# Patient Record
Sex: Female | Born: 1960 | Race: White | Hispanic: No | Marital: Married | Smoking: Never smoker
Health system: Southern US, Community
[De-identification: ages and names within clinical notes are randomized; demographics above are authoritative.]

## PROBLEM LIST (undated history)

## (undated) DIAGNOSIS — I872 Venous insufficiency (chronic) (peripheral): Secondary | ICD-10-CM

## (undated) DIAGNOSIS — E785 Hyperlipidemia, unspecified: Secondary | ICD-10-CM

## (undated) DIAGNOSIS — I10 Essential (primary) hypertension: Secondary | ICD-10-CM

## (undated) DIAGNOSIS — F32A Depression, unspecified: Secondary | ICD-10-CM

## (undated) DIAGNOSIS — R768 Other specified abnormal immunological findings in serum: Secondary | ICD-10-CM

## (undated) DIAGNOSIS — IMO0002 Reserved for concepts with insufficient information to code with codable children: Secondary | ICD-10-CM

## (undated) DIAGNOSIS — G629 Polyneuropathy, unspecified: Secondary | ICD-10-CM

## (undated) DIAGNOSIS — R7689 Other specified abnormal immunological findings in serum: Secondary | ICD-10-CM

## (undated) DIAGNOSIS — E079 Disorder of thyroid, unspecified: Secondary | ICD-10-CM

## (undated) HISTORY — DX: Other specified abnormal immunological findings in serum: R76.89

## (undated) HISTORY — DX: Other specified abnormal immunological findings in serum: R76.8

## (undated) HISTORY — DX: Polyneuropathy, unspecified: G62.9

## (undated) HISTORY — DX: Venous insufficiency (chronic) (peripheral): I87.2

## (undated) HISTORY — DX: Hyperlipidemia, unspecified: E78.5

## (undated) HISTORY — DX: Reserved for concepts with insufficient information to code with codable children: IMO0002

## (undated) HISTORY — DX: Depression, unspecified: F32.A

## (undated) HISTORY — DX: Disorder of thyroid, unspecified: E07.9

## (undated) HISTORY — DX: Essential (primary) hypertension: I10

---

## 1997-07-18 ENCOUNTER — Ambulatory Visit (HOSPITAL_COMMUNITY): Admission: RE | Admit: 1997-07-18 | Discharge: 1997-07-18 | Payer: Self-pay | Admitting: Family Medicine

## 1997-07-25 ENCOUNTER — Ambulatory Visit (HOSPITAL_COMMUNITY): Admission: RE | Admit: 1997-07-25 | Discharge: 1997-07-25 | Payer: Self-pay | Admitting: Family Medicine

## 2000-06-04 ENCOUNTER — Encounter: Payer: Self-pay | Admitting: Family Medicine

## 2000-06-04 ENCOUNTER — Ambulatory Visit (HOSPITAL_COMMUNITY): Admission: RE | Admit: 2000-06-04 | Discharge: 2000-06-04 | Payer: Self-pay | Admitting: Family Medicine

## 2003-12-13 ENCOUNTER — Other Ambulatory Visit: Admission: RE | Admit: 2003-12-13 | Discharge: 2003-12-13 | Payer: Self-pay | Admitting: Obstetrics and Gynecology

## 2006-01-09 HISTORY — PX: ABDOMINAL HYSTERECTOMY: SHX81

## 2006-01-25 ENCOUNTER — Encounter (INDEPENDENT_AMBULATORY_CARE_PROVIDER_SITE_OTHER): Payer: Self-pay | Admitting: *Deleted

## 2006-01-25 ENCOUNTER — Ambulatory Visit (HOSPITAL_COMMUNITY): Admission: RE | Admit: 2006-01-25 | Discharge: 2006-01-26 | Payer: Self-pay | Admitting: Obstetrics and Gynecology

## 2007-06-01 ENCOUNTER — Emergency Department (HOSPITAL_COMMUNITY): Admission: EM | Admit: 2007-06-01 | Discharge: 2007-06-01 | Payer: Self-pay | Admitting: Family Medicine

## 2010-06-27 NOTE — Discharge Summary (Signed)
Diana Hamilton, SEVERS NO.:  [id number]   MEDICAL RECORD NO.:  [id number]          PATIENT TYPE:  OIB   LOCATION:  9318                          FACILITY:  [facility]   PHYSICIAN:  Duke Salvia. Marcelle Overlie, M.D.DATE OF BIRTH:  1960-08-20   DATE OF ADMISSION:  01/25/2006  DATE OF DISCHARGE:  01/25/2006                               DISCHARGE SUMMARY   DISCHARGE DIAGNOSES:  1. Pelvic pain secondary to leiomyoma.  2. Laparoscopic-assisted vaginal hysterectomy this admission.   SUMMARY:  For physical examination, please see admission H&P for  details.  Briefly, a 50 year old with symptomatic leiomyoma, primarily  pelvic pain, dysmenorrhea presents for hysterectomy.   HOSPITAL COURSE:  On January 25, 2006, under general anesthesia the  patient underwent LAVH with conservation of both ovaries.  On the day  following surgery, her catheter was removed.  She was tolerating a  regular diet.  She was afebrile. Her abdominal exam was unremarkable and  she was ready for discharge.   Preoperative hemoglobin 13.2. Postoperative hemoglobin was 11.1 with a  WBC of 9900.  Other labs preoperative __UPT___ was negative.   DISPOSITION:  The patient was discharged on Tylox p.r.n. pain.  Will  return to the office in seven to 10 days.  Advised her to report any  incisional redness or drainage, _Increased_  pain or bleeding or fever  over 101.  She was given specific instructions regarding diet and  exercise.   CONDITION ON DISCHARGE:  Good.   ACTIVITY:  Graded increase.      Richard M. Marcelle Overlie, M.D.  Electronically Signed     [facility]/MEDQ  D:  01/26/2006  T:  01/26/2006  Job:  [id number]

## 2010-06-27 NOTE — Op Note (Signed)
NAMECHEALSEY, MIYAMOTO NO.:  [id number]   MEDICAL RECORD NO.:  [id number]          PATIENT TYPE:  AMB   LOCATION:  SDC                           FACILITY:  [facility]   PHYSICIAN:  Duke Salvia. Marcelle Overlie, M.D.DATE OF BIRTH:  Oct 17, 1960   DATE OF PROCEDURE:  01/25/2006  DATE OF DISCHARGE:                               OPERATIVE REPORT   PREOPERATIVE DIAGNOSIS:  Pelvic pain, leiomyoma.   POSTOP DIAGNOSIS:  Pelvic pain, leiomyoma.  Left adnexal adhesions.   PROCEDURE:  LAVH   SURGEON:  Dr. Marcelle Overlie   ASSISTANT:  Dineen Kid. Rana Snare, M.D.   ANESTHESIA:  General endotracheal.   SPECIMENS REMOVED:  Uterus   ESTIMATED BLOOD LOSS:  150.   PROCEDURE AND FINDINGS:  The patient was taken to the operating room and  after an adequate level of general endotracheal anesthesia was obtained  with the patient's legs stirrups.  The abdomen, perineum and vagina  prepped and draped in usual manner for laparoscopy.  Bladder was  drained, EUA carried out. Uterus mid position, normal size, adnexa  negative.  A Hulka tenaculum was positioned.  Attention directed to the  abdomen where 2 cm subumbilical incision was made after infiltrating  with 0.25% Marcaine plain.  The Veress needle was introduced without  difficulty.  Its intra-abdominal position verified by pressure and water  testing.  After 2.5 liter pneumoperitoneum was created, laparoscopic  trocar and sleeve was then introduced without difficulty.  Three  fingerbreadths above the symphysis in the midline a 5 mm trocar was  inserted.  The patient placed in Trendelenburg and the pelvic findings  as follows.   The uterus itself was upper limits normal size, right adnexa  unremarkable.  There was a Filshie clip on each tube. There were some  left adnexal adhesions from the epiploic of the sigmoid colon into the  area of the tubal which were lysed in avascular plane.  Both ovaries  were otherwise unremarkable.  After this was noted.  The  gyrus PK  instrument was used to coagulate and divide the utero-ovarian pedicle  down to and including the round ligament on each side with excellent  hemostasis. Laparoscopic portion was ended at that point. Attention  directed to the vaginal portion procedure.   The patient was placed flat, the legs extended.  Weighted speculum  positioned.  Cervicovaginal incision was made in circumferential manner.  Posterior culdotomy performed without difficulty.  The bladder was  advanced superiorly with sharp and blunt dissection until the peritoneal  reflection could be identified.  This was entered sharply and a  retractor used to gently elevate the bladder out of field. In sequential  manner the gyrus PK instrument was then used to coagulate and divide the  uterosacral ligament, cardinal ligament and uterine vasculature  pedicles.  Upper broad ligaments coagulated and divided. The fundus of  the uterus delivered posteriorly. Remaining pedicles coagulated and  divided. The cuff was closed from 3 to 9 o'clock with a running locked 3-  0 Vicryl suture.  Prior to closure sponge, needle, and instrument counts  reported as correct  x2.  Vaginal mucosa closed right-to-left with 2-0  Monocryl sutures.  Foley catheter positioned draining clear urine.  Repeat laparoscopy was carried out with Nezhat irrigator. Several small  areas of oozing were coagulated.  The operative site was further  irrigated and suctioned, pressure was reduced and noted to be  hemostatic. Prior to closure sponge, needle, and instrument counts were  reported correct x2.  The defect closed with Dermabond and 4-0 Vicryl  Rapide subcuticular. She went to recovery room in good condition at that  point.      Richard M. Marcelle Overlie, M.D.  Electronically Signed     [facility]/MEDQ  D:  01/25/2006  T:  01/25/2006  Job:  [id number]

## 2010-06-27 NOTE — H&P (Signed)
Diana Hamilton, Diana Hamilton NO.:  [id number]   MEDICAL RECORD NO.:  [id number]          PATIENT TYPE:  OIB   LOCATION:  9318                          FACILITY:  [facility]   PHYSICIAN:  Duke Salvia. Marcelle Overlie, M.D.DATE OF BIRTH:  1961/01/24   DATE OF ADMISSION:  01/25/2006  DATE OF DISCHARGE:                              HISTORY & PHYSICAL   CHIEF COMPLAINT:  Pelvic pain secondary to leiomyoma.   HISTORY OF PRESENT ILLNESS:  [name] G2, P2, has had a prior  tubal. She has had 1 vaginal delivery, 1 cesarean section. Experiencing  increased pelvic pain during her period, she has a history of known  small fibroids. Recent ultrasound dated May 2007 showed several small  fibroids with possible adenomyosis, adnexa unremarkable. She presents  now for LAVH. This procedure, including risks of bleeding, infection,  transfusion, adjacent organ injury, the possible need for open or  additional surgery all reviewed with her, which she understands and  accepts.   PAST MEDICAL HISTORY:  Current medications:  Lisinopril and Motrin  p.r.n.  Allergies none.  Other surgery:  Laparoscopic tubal ligation and cesarean section.   FAMILY HISTORY:  Unremarkable.   PHYSICAL EXAMINATION:  Temp 98.2, blood pressure 120/82.  HEENT:  Unremarkable.  NECK:  Supple without masses.  LUNGS:  Clear.  CARDIOVASCULAR:  Regular rate and rhythm without murmurs, rubs, gallops  noted.  BREASTS:  Without masses.  ABDOMEN:  Soft, flat, nontender.  PELVIC EXAM:  Normal external genitalia, vagina and cervix clear, uterus  mid position, normal size, adnexa negative.  EXTREMITIES/NEUROLOGIC EXAM:  Unremarkable.   IMPRESSION:  Increased pelvic pain, ultrasound evidence of adenomyosis  and small leiomyoma.   Laparoscopic-assisted vaginal hysterectomy. Procedure and risks reviewed  as above.      Richard M. Marcelle Overlie, M.D.  Electronically Signed     [facility]/MEDQ  D:  01/25/2006  T:  01/25/2006  Job:   [id number]

## 2010-10-08 ENCOUNTER — Other Ambulatory Visit: Payer: Self-pay | Admitting: Obstetrics and Gynecology

## 2012-07-29 LAB — HM COLONOSCOPY: HM Colonoscopy: NORMAL

## 2012-09-09 ENCOUNTER — Encounter: Payer: Self-pay | Admitting: Family Medicine

## 2012-09-09 DIAGNOSIS — I872 Venous insufficiency (chronic) (peripheral): Secondary | ICD-10-CM | POA: Insufficient documentation

## 2012-09-09 DIAGNOSIS — I10 Essential (primary) hypertension: Secondary | ICD-10-CM | POA: Insufficient documentation

## 2012-09-09 DIAGNOSIS — IMO0002 Reserved for concepts with insufficient information to code with codable children: Secondary | ICD-10-CM | POA: Insufficient documentation

## 2012-09-09 DIAGNOSIS — R768 Other specified abnormal immunological findings in serum: Secondary | ICD-10-CM | POA: Insufficient documentation

## 2012-12-27 ENCOUNTER — Ambulatory Visit: Payer: Self-pay

## 2012-12-27 ENCOUNTER — Other Ambulatory Visit: Payer: Self-pay | Admitting: Occupational Medicine

## 2012-12-27 DIAGNOSIS — R52 Pain, unspecified: Secondary | ICD-10-CM

## 2013-01-06 ENCOUNTER — Ambulatory Visit (INDEPENDENT_AMBULATORY_CARE_PROVIDER_SITE_OTHER): Payer: PRIVATE HEALTH INSURANCE | Admitting: Family Medicine

## 2013-01-06 ENCOUNTER — Encounter: Payer: Self-pay | Admitting: Family Medicine

## 2013-01-06 VITALS — BP 150/100 | HR 68 | Temp 98.3°F | Resp 18 | Wt 228.0 lb

## 2013-01-06 DIAGNOSIS — I10 Essential (primary) hypertension: Secondary | ICD-10-CM

## 2013-01-06 MED ORDER — LISINOPRIL-HYDROCHLOROTHIAZIDE 20-12.5 MG PO TABS: 1.0000 | ORAL_TABLET | Freq: Every day | ORAL | Status: DC

## 2013-01-06 NOTE — Progress Notes (Signed)
   Subjective:    Patient ID: Diana Hamilton, female    DOB: Nov 19, 1960, 52 y.o.   MRN: [id number]  HPI Patient is here today for high blood pressure. She has a history of chronic long-standing hypertension. She's been off the medication now for several months. Her blood pressure is 150/100. She has a family history is positive for stroke and chronic kidney failure in her father due to uncontrolled hypertension. She is now interested in trying to get her blood pressure under control. She denies any chest pain, shortness of breath, dyspnea on exertion. She denies any headache or blurry vision. Past Medical History  Diagnosis Date  . Positive ANA (antinuclear antibody)   . DDD (degenerative disc disease)   . Hypertension   . Chronic venous insufficiency    No current outpatient prescriptions on file prior to visit.   No current facility-administered medications on file prior to visit.   Allergies  Allergen Reactions  . Sulfa Antibiotics    History   Social History  . Marital Status: Married    Spouse Name: N/A    Number of Children: N/A  . Years of Education: N/A   Occupational History  . Not on file.   Social History Main Topics  . Smoking status: Never Smoker   . Smokeless tobacco: Never Used  . Alcohol Use: No  . Drug Use: No  . Sexual Activity: Not on file   Other Topics Concern  . Not on file   Social History Narrative  . No narrative on file      Review of Systems  All other systems reviewed and are negative.       Objective:   Physical Exam  Vitals reviewed. Neck: No JVD present. No thyromegaly present.  Cardiovascular: Normal rate, regular rhythm and normal heart sounds.  Exam reveals no gallop and no friction rub.   No murmur heard. Pulmonary/Chest: Effort normal and breath sounds normal. No respiratory distress. She has no wheezes. She has no rales.  Abdominal: Soft. Bowel sounds are normal. She exhibits no distension. There is no tenderness. There  is no rebound and no guarding.  Lymphadenopathy:    She has no cervical adenopathy.          Assessment & Plan:  1. HTN (hypertension) Start patient on Zestoretic 20/12.5 one by mouth daily. Recheck blood pressure and a BMP in one month - lisinopril-hydrochlorothiazide (ZESTORETIC) 20-12.5 MG per tablet; Take 1 tablet by mouth daily.  Dispense: 30 tablet; Refill: 11

## 2013-03-21 ENCOUNTER — Other Ambulatory Visit: Payer: PRIVATE HEALTH INSURANCE

## 2013-03-21 DIAGNOSIS — Z79899 Other long term (current) drug therapy: Secondary | ICD-10-CM

## 2013-03-21 DIAGNOSIS — I10 Essential (primary) hypertension: Secondary | ICD-10-CM

## 2013-03-21 DIAGNOSIS — Z0000 Encounter for general adult medical examination without abnormal findings: Secondary | ICD-10-CM

## 2013-03-21 LAB — CBC WITH DIFFERENTIAL/PLATELET
BASOS ABS: 0 10*3/uL (ref 0.0–0.1)
BASOS PCT: 0 % (ref 0–1)
Eosinophils Absolute: 0.1 10*3/uL (ref 0.0–0.7)
Eosinophils Relative: 2 % (ref 0–5)
HCT: 40.7 % (ref 36.0–46.0)
HEMOGLOBIN: 13.8 g/dL (ref 12.0–15.0)
Lymphocytes Relative: 27 % (ref 12–46)
Lymphs Abs: 1.9 10*3/uL (ref 0.7–4.0)
MCH: 29.6 pg (ref 26.0–34.0)
MCHC: 33.9 g/dL (ref 30.0–36.0)
MCV: 87.2 fL (ref 78.0–100.0)
Monocytes Absolute: 0.6 10*3/uL (ref 0.1–1.0)
Monocytes Relative: 9 % (ref 3–12)
NEUTROS ABS: 4.2 10*3/uL (ref 1.7–7.7)
Neutrophils Relative %: 62 % (ref 43–77)
Platelets: 313 10*3/uL (ref 150–400)
RBC: 4.67 MIL/uL (ref 3.87–5.11)
RDW: 14.2 % (ref 11.5–15.5)
WBC: 6.8 10*3/uL (ref 4.0–10.5)

## 2013-03-21 LAB — COMPLETE METABOLIC PANEL WITH GFR
ALT: 12 U/L (ref 0–35)
AST: 15 U/L (ref 0–37)
Albumin: 4 g/dL (ref 3.5–5.2)
Alkaline Phosphatase: 62 U/L (ref 39–117)
BUN: 19 mg/dL (ref 6–23)
CO2: 23 meq/L (ref 19–32)
Calcium: 9.2 mg/dL (ref 8.4–10.5)
Chloride: 102 mEq/L (ref 96–112)
Creat: 0.73 mg/dL (ref 0.50–1.10)
GLUCOSE: 85 mg/dL (ref 70–99)
POTASSIUM: 4.1 meq/L (ref 3.5–5.3)
SODIUM: 135 meq/L (ref 135–145)
TOTAL PROTEIN: 7.5 g/dL (ref 6.0–8.3)
Total Bilirubin: 0.4 mg/dL (ref 0.2–1.2)

## 2013-03-21 LAB — TSH: TSH: 3.52 u[IU]/mL (ref 0.350–4.500)

## 2013-03-21 LAB — LIPID PANEL
CHOLESTEROL: 167 mg/dL (ref 0–200)
HDL: 46 mg/dL (ref >39)
LDL Cholesterol: 104 mg/dL — ABNORMAL HIGH (ref 0–99)
Total CHOL/HDL Ratio: 3.6 Ratio
Triglycerides: 84 mg/dL (ref <150)
VLDL: 17 mg/dL (ref 0–40)

## 2013-03-22 LAB — VITAMIN D 25 HYDROXY (VIT D DEFICIENCY, FRACTURES): Vit D, 25-Hydroxy: 58 ng/mL (ref 30–89)

## 2013-03-23 ENCOUNTER — Encounter: Payer: Self-pay | Admitting: Family Medicine

## 2013-03-23 ENCOUNTER — Ambulatory Visit (INDEPENDENT_AMBULATORY_CARE_PROVIDER_SITE_OTHER): Payer: Managed Care, Other (non HMO) | Admitting: Family Medicine

## 2013-03-23 VITALS — BP 118/80 | HR 82 | Temp 97.7°F | Resp 18 | Ht 65.0 in | Wt 231.5 lb

## 2013-03-23 DIAGNOSIS — Z0000 Encounter for general adult medical examination without abnormal findings: Secondary | ICD-10-CM

## 2013-03-23 NOTE — Progress Notes (Signed)
Subjective:    Patient ID: Diana Hamilton, female    DOB: October 28, 1960, 53 y.o.   MRN: [id number]  HPI Patient is here today for complete physical exam. She has no concerns. Her last colonoscopy was last year and was normal. She has had her flu shot. Her tetanus shot is up-to-date. She recently had blood work which is listed below. Lab on 03/21/2013  Component Date Value Ref Range Status  . WBC 03/21/2013 6.8  4.0 - 10.5 K/uL Final  . RBC 03/21/2013 4.67  3.87 - 5.11 MIL/uL Final  . Hemoglobin 03/21/2013 13.8  12.0 - 15.0 g/dL Final  . HCT 03/21/2013 40.7  36.0 - 46.0 % Final  . MCV 03/21/2013 87.2  78.0 - 100.0 fL Final  . MCH 03/21/2013 29.6  26.0 - 34.0 pg Final  . MCHC 03/21/2013 33.9  30.0 - 36.0 g/dL Final  . RDW 03/21/2013 14.2  11.5 - 15.5 % Final  . Platelets 03/21/2013 313  150 - 400 K/uL Final  . Neutrophils Relative % 03/21/2013 62  43 - 77 % Final  . Neutro Abs 03/21/2013 4.2  1.7 - 7.7 K/uL Final  . Lymphocytes Relative 03/21/2013 27  12 - 46 % Final  . Lymphs Abs 03/21/2013 1.9  0.7 - 4.0 K/uL Final  . Monocytes Relative 03/21/2013 9  3 - 12 % Final  . Monocytes Absolute 03/21/2013 0.6  0.1 - 1.0 K/uL Final  . Eosinophils Relative 03/21/2013 2  0 - 5 % Final  . Eosinophils Absolute 03/21/2013 0.1  0.0 - 0.7 K/uL Final  . Basophils Relative 03/21/2013 0  0 - 1 % Final  . Basophils Absolute 03/21/2013 0.0  0.0 - 0.1 K/uL Final  . Smear Review 03/21/2013 Criteria for review not met   Final  . Cholesterol 03/21/2013 167  0 - 200 mg/dL Final   Comment: ATP III Classification:                                < 200        mg/dL        Desirable                               200 - 239     mg/dL        Borderline High                               >= 240        mg/dL        High                             . Triglycerides 03/21/2013 84  <150 mg/dL Final  . HDL 03/21/2013 46  >39 mg/dL Final  . Total CHOL/HDL Ratio 03/21/2013 3.6   Final  . VLDL 03/21/2013 17  0 - 40 mg/dL  Final  . LDL Cholesterol 03/21/2013 104* 0 - 99 mg/dL Final   Comment:                            Total Cholesterol/HDL Ratio:CHD Risk  Coronary Heart Disease Risk Table                                                                 Men       Women                                   1/2 Average Risk              3.4        3.3                                       Average Risk              5.0        4.4                                    2X Average Risk              9.6        7.1                                    3X Average Risk             23.4       11.0                          Use the calculated Patient Ratio above and the CHD Risk table                           to determine the patient's CHD Risk.                          ATP III Classification (LDL):                                < 100        mg/dL         Optimal                               100 - 129     mg/dL         Near or Above Optimal                               130 - 159     mg/dL         Borderline High                               160 - 189     mg/dL           High                                > 190        mg/dL         Very High                             . TSH 03/21/2013 3.520  0.350 - 4.500 uIU/mL Final  . Vit D, 25-Hydroxy 03/21/2013 58  30 - 89 ng/mL Final   Comment: This assay accurately quantifies Vitamin D, which is the sum of the                          25-Hydroxy forms of Vitamin D2 and D3.  Studies have shown that the                          optimum concentration of 25-Hydroxy Vitamin D is 30 ng/mL or higher.                           Concentrations of Vitamin D between 20 and 29 ng/mL are considered to                          be insufficient and concentrations less than 20 ng/mL are considered                          to be deficient for Vitamin D.  . Sodium 03/21/2013 135  135 - 145 mEq/L Final  . Potassium 03/21/2013 4.1  3.5 - 5.3 mEq/L Final  .  Chloride 03/21/2013 102  96 - 112 mEq/L Final  . CO2 03/21/2013 23  19 - 32 mEq/L Final  . Glucose, Bld 03/21/2013 85  70 - 99 mg/dL Final  . BUN 03/21/2013 19  6 - 23 mg/dL Final  . Creat 03/21/2013 0.73  0.50 - 1.10 mg/dL Final  . Total Bilirubin 03/21/2013 0.4  0.2 - 1.2 mg/dL Final   ** Please note change in reference range(s). **  . Alkaline Phosphatase 03/21/2013 62  39 - 117 U/L Final  . AST 03/21/2013 15  0 - 37 U/L Final  . ALT 03/21/2013 12  0 - 35 U/L Final  . Total Protein 03/21/2013 7.5  6.0 - 8.3 g/dL Final  . Albumin 03/21/2013 4.0  3.5 - 5.2 g/dL Final  . Calcium 03/21/2013 9.2  8.4 - 10.5 mg/dL Final  . GFR, Est African American 03/21/2013 >89   Final  . GFR, Est Non African American 03/21/2013 >89   Final   Comment:                            The estimated GFR is a calculation valid for adults (>=47 years old)                          that uses the CKD-EPI algorithm to adjust for age and sex. It is                            not to be used for  children, pregnant women, hospitalized patients,                             patients on dialysis, or with rapidly changing kidney function.                          According to the NKDEP, eGFR >89 is normal, 60-89 shows mild                          impairment, 30-59 shows moderate impairment, 15-29 shows severe                          impairment and <15 is ESRD.                              She has an appointment to see her gynecologist in March who performed her mammogram, breast exam, and Pap smear. She gets these performed every year. She has a history of a hysterectomy. Past Medical History  Diagnosis Date  . Positive ANA (antinuclear antibody)   . DDD (degenerative disc disease)   . Hypertension   . Chronic venous insufficiency    Past Surgical History  Procedure Laterality Date  . Abdominal hysterectomy  01/2006    fibroids   Current outpatient prescriptions:lisinopril-hydrochlorothiazide (ZESTORETIC) 20-12.5 MG  per tablet, Take 1 tablet by mouth daily., Disp: 30 tablet, Rfl: 11 Allergies  Allergen Reactions  . Sulfa Antibiotics    History   Social History  . Marital Status: Married    Spouse Name: N/A    Number of Children: N/A  . Years of Education: N/A   Occupational History  . Not on file.   Social History Main Topics  . Smoking status: Never Smoker   . Smokeless tobacco: Never Used  . Alcohol Use: No  . Drug Use: No  . Sexual Activity: Not on file   Other Topics Concern  . Not on file   Social History Narrative  . No narrative on file    Family History  Problem Relation Age of Onset  . Heart disease Mother     arrhythmia  . Cancer Mother 17    breast  . Hypertension Father   . Stroke Father   . Cancer Sister 67    breast      Review of Systems  All other systems reviewed and are negative.       Objective:   Physical Exam  Vitals reviewed. Constitutional: She is oriented to person, place, and time. She appears well-developed and well-nourished. No distress.  HENT:  Head: Normocephalic and atraumatic.  Right Ear: External ear normal.  Left Ear: External ear normal.  Nose: Nose normal.  Mouth/Throat: Oropharynx is clear and moist. No oropharyngeal exudate.  Eyes: Conjunctivae and EOM are normal. Pupils are equal, round, and reactive to light. Right eye exhibits no discharge. Left eye exhibits no discharge. No scleral icterus.  Neck: Normal range of motion. Neck supple. No JVD present. No tracheal deviation present. No thyromegaly present.  Cardiovascular: Normal rate, regular rhythm, normal heart sounds and intact distal pulses.  Exam reveals no gallop and no friction rub.   No murmur heard. Pulmonary/Chest: Effort normal and breath sounds normal. No stridor. No respiratory distress. She has no wheezes. She has no rales.  She exhibits no tenderness.  Abdominal: Soft. Bowel sounds are normal. She exhibits no distension and no mass. There is no tenderness.  There is no rebound and no guarding.  Musculoskeletal: Normal range of motion. She exhibits no edema and no tenderness.  Lymphadenopathy:    She has no cervical adenopathy.  Neurological: She is alert and oriented to person, place, and time. She has normal reflexes. She displays normal reflexes. No cranial nerve deficit. She exhibits normal muscle tone. Coordination normal.  Skin: Skin is warm. No rash noted. She is not diaphoretic. No erythema. No pallor.  Psychiatric: She has a normal mood and affect. Her behavior is normal. Judgment and thought content normal.          Assessment & Plan:  1. Routine general medical examination at a health care facility Physical exam is normal except for her elevated BMI. I recommended increasing aerobic exercise gradually to one hour a day 5 days a week. I recommended 1500 calories per day diet. I recommended a well-balanced diet. Her blood pressures currently well controlled and the remainder of her lab work is excellent. Her immunizations are up to date. Her care screening is up to date. Recheck in one year.

## 2014-02-26 ENCOUNTER — Other Ambulatory Visit: Payer: PRIVATE HEALTH INSURANCE

## 2014-02-26 DIAGNOSIS — E7800 Pure hypercholesterolemia, unspecified: Secondary | ICD-10-CM

## 2014-02-26 DIAGNOSIS — Z0000 Encounter for general adult medical examination without abnormal findings: Secondary | ICD-10-CM

## 2014-02-26 DIAGNOSIS — Z79899 Other long term (current) drug therapy: Secondary | ICD-10-CM

## 2014-02-26 DIAGNOSIS — I10 Essential (primary) hypertension: Secondary | ICD-10-CM

## 2014-02-26 LAB — COMPLETE METABOLIC PANEL WITH GFR
ALK PHOS: 66 U/L (ref 39–117)
ALT: 31 U/L (ref 0–35)
AST: 22 U/L (ref 0–37)
Albumin: 3.8 g/dL (ref 3.5–5.2)
BILIRUBIN TOTAL: 0.3 mg/dL (ref 0.2–1.2)
BUN: 16 mg/dL (ref 6–23)
CALCIUM: 9.2 mg/dL (ref 8.4–10.5)
CO2: 23 mEq/L (ref 19–32)
Chloride: 106 mEq/L (ref 96–112)
Creat: 0.74 mg/dL (ref 0.50–1.10)
Glucose, Bld: 89 mg/dL (ref 70–99)
Potassium: 4.3 mEq/L (ref 3.5–5.3)
Sodium: 138 mEq/L (ref 135–145)
TOTAL PROTEIN: 7.4 g/dL (ref 6.0–8.3)

## 2014-02-26 LAB — LIPID PANEL
CHOL/HDL RATIO: 3.5 ratio
Cholesterol: 163 mg/dL (ref 0–200)
HDL: 46 mg/dL (ref >39)
LDL Cholesterol: 90 mg/dL (ref 0–99)
Triglycerides: 133 mg/dL (ref <150)
VLDL: 27 mg/dL (ref 0–40)

## 2014-02-26 LAB — CBC WITH DIFFERENTIAL/PLATELET
Basophils Absolute: 0.1 10*3/uL (ref 0.0–0.1)
Basophils Relative: 1 % (ref 0–1)
EOS PCT: 4 % (ref 0–5)
Eosinophils Absolute: 0.2 10*3/uL (ref 0.0–0.7)
HCT: 39.5 % (ref 36.0–46.0)
Hemoglobin: 12.9 g/dL (ref 12.0–15.0)
Lymphocytes Relative: 34 % (ref 12–46)
Lymphs Abs: 1.8 10*3/uL (ref 0.7–4.0)
MCH: 28.1 pg (ref 26.0–34.0)
MCHC: 32.7 g/dL (ref 30.0–36.0)
MCV: 86.1 fL (ref 78.0–100.0)
MPV: 10.4 fL (ref 8.6–12.4)
Monocytes Absolute: 0.5 10*3/uL (ref 0.1–1.0)
Monocytes Relative: 10 % (ref 3–12)
NEUTROS PCT: 51 % (ref 43–77)
Neutro Abs: 2.8 10*3/uL (ref 1.7–7.7)
Platelets: 282 10*3/uL (ref 150–400)
RBC: 4.59 MIL/uL (ref 3.87–5.11)
RDW: 13.9 % (ref 11.5–15.5)
WBC: 5.4 10*3/uL (ref 4.0–10.5)

## 2014-02-26 LAB — TSH: TSH: 5.727 u[IU]/mL — ABNORMAL HIGH (ref 0.350–4.500)

## 2014-03-02 ENCOUNTER — Encounter: Payer: Self-pay | Admitting: Family Medicine

## 2014-03-26 ENCOUNTER — Encounter: Payer: PRIVATE HEALTH INSURANCE | Admitting: Family Medicine

## 2014-04-05 ENCOUNTER — Ambulatory Visit (INDEPENDENT_AMBULATORY_CARE_PROVIDER_SITE_OTHER): Payer: 59 | Admitting: Family Medicine

## 2014-04-05 ENCOUNTER — Encounter: Payer: Self-pay | Admitting: Family Medicine

## 2014-04-05 VITALS — BP 146/98 | HR 86 | Temp 98.3°F | Resp 18 | Ht 65.0 in | Wt 239.0 lb

## 2014-04-05 DIAGNOSIS — E038 Other specified hypothyroidism: Secondary | ICD-10-CM

## 2014-04-05 DIAGNOSIS — Z1231 Encounter for screening mammogram for malignant neoplasm of breast: Secondary | ICD-10-CM | POA: Diagnosis not present

## 2014-04-05 DIAGNOSIS — Z0000 Encounter for general adult medical examination without abnormal findings: Secondary | ICD-10-CM

## 2014-04-05 MED ORDER — LEVOTHYROXINE SODIUM 25 MCG PO TABS: 25.0000 ug | ORAL_TABLET | Freq: Every day | ORAL | Status: DC

## 2014-04-06 ENCOUNTER — Encounter: Payer: Self-pay | Admitting: Family Medicine

## 2014-04-06 NOTE — Progress Notes (Signed)
Subjective:    Patient ID: Diana Hamilton, female    DOB: 07/16/1960, 54 y.o.   MRN: [id number]  HPI Patient is here today for complete physical exam. However the majority of her visit was spent in discussion regarding her obesity. Patient is extremely depressed. She is having severe marital problems. Patient's husband has an alcohol problem. He is unwilling to seek counseling. He is unwilling to seek marital counseling. This has the patient depressed. She endorses anhedonia. She reports his insomnia. She admits that she eats not out of hunger but in an effort to find happiness. Her most recent lab work is below: No visits with results within 1 Month(s) from this visit. Latest known visit with results is:  Lab on 02/26/2014  Component Date Value Ref Range Status  . Sodium 02/26/2014 138  135 - 145 mEq/L Final  . Potassium 02/26/2014 4.3  3.5 - 5.3 mEq/L Final  . Chloride 02/26/2014 106  96 - 112 mEq/L Final  . CO2 02/26/2014 23  19 - 32 mEq/L Final  . Glucose, Bld 02/26/2014 89  70 - 99 mg/dL Final  . BUN 02/26/2014 16  6 - 23 mg/dL Final  . Creat 02/26/2014 0.74  0.50 - 1.10 mg/dL Final  . Total Bilirubin 02/26/2014 0.3  0.2 - 1.2 mg/dL Final  . Alkaline Phosphatase 02/26/2014 66  39 - 117 U/L Final  . AST 02/26/2014 22  0 - 37 U/L Final  . ALT 02/26/2014 31  0 - 35 U/L Final  . Total Protein 02/26/2014 7.4  6.0 - 8.3 g/dL Final  . Albumin 02/26/2014 3.8  3.5 - 5.2 g/dL Final  . Calcium 02/26/2014 9.2  8.4 - 10.5 mg/dL Final  . GFR, Est African American 02/26/2014 >89   Final  . GFR, Est Non African American 02/26/2014 >89   Final   Comment:   The estimated GFR is a calculation valid for adults (>=90 years old) that uses the CKD-EPI algorithm to adjust for age and sex. It is   not to be used for children, pregnant women, hospitalized patients,    patients on dialysis, or with rapidly changing kidney function. According to the NKDEP, eGFR >89 is normal, 60-89 shows mild impairment,  30-59 shows moderate impairment, 15-29 shows severe impairment and <15 is ESRD.     . TSH 02/26/2014 5.727* 0.350 - 4.500 uIU/mL Final  . Cholesterol 02/26/2014 163  0 - 200 mg/dL Final   Comment: ATP III Classification:       < 200        mg/dL        Desirable      200 - 239     mg/dL        Borderline High      >= 240        mg/dL        High     . Triglycerides 02/26/2014 133  <150 mg/dL Final  . HDL 02/26/2014 46  >39 mg/dL Final  . Total CHOL/HDL Ratio 02/26/2014 3.5   Final  . VLDL 02/26/2014 27  0 - 40 mg/dL Final  . LDL Cholesterol 02/26/2014 90  0 - 99 mg/dL Final   Comment:   Total Cholesterol/HDL Ratio:CHD Risk                        Coronary Heart Disease Risk Table  Men       Women          1/2 Average Risk              3.4        3.3              Average Risk              5.0        4.4           2X Average Risk              9.6        7.1           3X Average Risk             23.4       11.0 Use the calculated Patient Ratio above and the CHD Risk table  to determine the patient's CHD Risk. ATP III Classification (LDL):       < 100        mg/dL         Optimal      100 - 129     mg/dL         Near or Above Optimal      130 - 159     mg/dL         Borderline High      160 - 189     mg/dL         High       > 190        mg/dL         Very High     . WBC 02/26/2014 5.4  4.0 - 10.5 K/uL Final  . RBC 02/26/2014 4.59  3.87 - 5.11 MIL/uL Final  . Hemoglobin 02/26/2014 12.9  12.0 - 15.0 g/dL Final  . HCT 02/26/2014 39.5  36.0 - 46.0 % Final  . MCV 02/26/2014 86.1  78.0 - 100.0 fL Final  . MCH 02/26/2014 28.1  26.0 - 34.0 pg Final  . MCHC 02/26/2014 32.7  30.0 - 36.0 g/dL Final  . RDW 02/26/2014 13.9  11.5 - 15.5 % Final  . Platelets 02/26/2014 282  150 - 400 K/uL Final  . MPV 02/26/2014 10.4  8.6 - 12.4 fL Final   ** Please note change in reference range(s). **  . Neutrophils Relative % 02/26/2014 51  43 - 77 % Final  .  Neutro Abs 02/26/2014 2.8  1.7 - 7.7 K/uL Final  . Lymphocytes Relative 02/26/2014 34  12 - 46 % Final  . Lymphs Abs 02/26/2014 1.8  0.7 - 4.0 K/uL Final  . Monocytes Relative 02/26/2014 10  3 - 12 % Final  . Monocytes Absolute 02/26/2014 0.5  0.1 - 1.0 K/uL Final  . Eosinophils Relative 02/26/2014 4  0 - 5 % Final  . Eosinophils Absolute 02/26/2014 0.2  0.0 - 0.7 K/uL Final  . Basophils Relative 02/26/2014 1  0 - 1 % Final  . Basophils Absolute 02/26/2014 0.1  0.0 - 0.1 K/uL Final  . Smear Review 02/26/2014 Criteria for review not met   Final   Last colonoscopy was in 2014. Patient is due for mammogram. Patient's blood pressure is elevated but she has discontinued her blood pressure medication Past Medical History  Diagnosis Date  . Positive ANA (antinuclear antibody)   . DDD (degenerative disc disease)   . Hypertension   . Chronic  venous insufficiency    Past Surgical History  Procedure Laterality Date  . Abdominal hysterectomy  01/2006    fibroids   Current Outpatient Prescriptions on File Prior to Visit  Medication Sig Dispense Refill  . lisinopril-hydrochlorothiazide (ZESTORETIC) 20-12.5 MG per tablet Take 1 tablet by mouth daily. (Patient not taking: Reported on 04/05/2014) 30 tablet 11   No current facility-administered medications on file prior to visit.   Allergies  Allergen Reactions  . Sulfa Antibiotics    History   Social History  . Marital Status: Married    Spouse Name: N/A  . Number of Children: N/A  . Years of Education: N/A   Occupational History  . Not on file.   Social History Main Topics  . Smoking status: Never Smoker   . Smokeless tobacco: Never Used  . Alcohol Use: No  . Drug Use: No  . Sexual Activity: Not on file   Other Topics Concern  . Not on file   Social History Narrative   Family History  Problem Relation Age of Onset  . Heart disease Mother     arrhythmia  . Cancer Mother 10    breast  . Hypertension Father   . Stroke  Father   . Cancer Sister 94    breast  '  Review of Systems  All other systems reviewed and are negative.      Objective:   Physical Exam  Constitutional: She appears well-developed and well-nourished.  Cardiovascular: Normal rate, regular rhythm and normal heart sounds.   No murmur heard. Pulmonary/Chest: Effort normal and breath sounds normal. No respiratory distress. She has no wheezes. She has no rales.  Abdominal: Soft. Bowel sounds are normal.  Musculoskeletal: She exhibits edema.  Psychiatric: Her behavior is normal. Judgment and thought content normal. Cognition and memory are normal. She exhibits a depressed mood.  Vitals reviewed.         Assessment & Plan:  Other specified hypothyroidism - Plan: levothyroxine (LEVOTHROID) 25 MCG tablet  Routine general medical examination at a health care facility  Physical exam was cut short as more than 30 minutes were spent in discussion regarding options to treat her depression and to help her marriage. I will start the patient on levothyroxine 25 g by mouth daily due to her subclinical hypothyroidism in an effort to try to help facilitate some weight loss. Also recommended diet exercise as means to help facilitate weight loss. I would like to recheck the patient's TSH and her blood pressure in 6 months. Hopefully if she loses weight her blood pressure will improve. If not, at that time, I would resume her blood pressure medication. I will also schedule the patient for mammogram.

## 2014-04-10 ENCOUNTER — Encounter: Payer: Self-pay | Admitting: *Deleted

## 2014-04-19 ENCOUNTER — Other Ambulatory Visit: Payer: Self-pay | Admitting: Family Medicine

## 2014-04-19 DIAGNOSIS — Z1231 Encounter for screening mammogram for malignant neoplasm of breast: Secondary | ICD-10-CM

## 2014-04-27 ENCOUNTER — Ambulatory Visit (INDEPENDENT_AMBULATORY_CARE_PROVIDER_SITE_OTHER): Payer: 59 | Admitting: Family Medicine

## 2014-04-27 ENCOUNTER — Encounter: Payer: Self-pay | Admitting: Family Medicine

## 2014-04-27 VITALS — BP 150/98 | HR 88 | Temp 98.4°F | Resp 18 | Ht 65.0 in | Wt 237.0 lb

## 2014-04-27 DIAGNOSIS — M7552 Bursitis of left shoulder: Secondary | ICD-10-CM | POA: Diagnosis not present

## 2014-04-27 DIAGNOSIS — I10 Essential (primary) hypertension: Secondary | ICD-10-CM | POA: Diagnosis not present

## 2014-04-27 DIAGNOSIS — E038 Other specified hypothyroidism: Secondary | ICD-10-CM | POA: Diagnosis not present

## 2014-04-27 MED ORDER — LISINOPRIL 20 MG PO TABS: 20.0000 mg | ORAL_TABLET | Freq: Every day | ORAL | Status: DC

## 2014-04-27 MED ORDER — FUROSEMIDE 20 MG PO TABS: 20.0000 mg | ORAL_TABLET | Freq: Every day | ORAL | Status: DC | PRN

## 2014-04-27 NOTE — Progress Notes (Signed)
Subjective:    Patient ID: Diana Hamilton, female    DOB: [birth year], 54 y.o.   MRN: [id number]  HPI Patient's blood pressure remains significantly elevated. Today it is 150/98. She denies any chest pain shortness of breath or dyspnea on exertion. She is interested in resuming lisinopril. She is also requesting a fluid pill that she could use a as needed for fluid retention in her legs. Today she has trace pretibial edema but she explains that certain day she'll have +1 pretibial edema which aches and throbs and burns. She is also due to recheck a TSH. Her other concern is pain in her left shoulder. The pain began approximately 2 years ago. At that time she injured her right shoulder at work lifting heavy objects. She suffered a partial tear in her rotator cuff. He was treated conservatively with cortisone injections and physical therapy. Since that time she has avoided using her right arm to perform overhead activities. Instead she's been using her left arm. Greater than 70. Today on exam she has a positive empty can sign. She also has a positive Hawkins sign. She has diminished strength with resisted abduction  Past Medical History  Diagnosis Date  . Positive ANA (antinuclear antibody)   . DDD (degenerative disc disease)   . Hypertension   . Chronic venous insufficiency    Past Surgical History  Procedure Laterality Date  . Abdominal hysterectomy  01/2006    fibroids   Current Outpatient Prescriptions on File Prior to Visit  Medication Sig Dispense Refill  . levothyroxine (LEVOTHROID) 25 MCG tablet Take 1 tablet (25 mcg total) by mouth daily before breakfast. 30 tablet 5   No current facility-administered medications on file prior to visit.   Allergies  Allergen Reactions  . Sulfa Antibiotics    History   Social History  . Marital Status: Married    Spouse Name: N/A  . Number of Children: N/A  . Years of Education: N/A   Occupational History  . Not on file.   Social History  Main Topics  . Smoking status: Never Smoker   . Smokeless tobacco: Never Used  . Alcohol Use: No  . Drug Use: No  . Sexual Activity: Not on file   Other Topics Concern  . Not on file   Social History Narrative      Review of Systems  All other systems reviewed and are negative.      Objective:   Physical Exam  Cardiovascular: Normal rate and regular rhythm.   No murmur heard. Pulmonary/Chest: Effort normal and breath sounds normal. No respiratory distress. She has no wheezes. She has no rales.  Abdominal: Soft. Bowel sounds are normal.  Musculoskeletal:       Left shoulder: She exhibits decreased range of motion, tenderness, pain and decreased strength.  Vitals reviewed.         Assessment & Plan:  Other specified hypothyroidism - Plan: TSH  Benign essential HTN - Plan: lisinopril (PRINIVIL,ZESTRIL) 20 MG tablet, furosemide (LASIX) 20 MG tablet  Subacromial bursitis, left  I will check the patient's TSH. I will start the patient on lisinopril 20 mg a day for hypertension. Recheck blood pressure in one month I will also give the patient Lasix 20 mg to be used once a day as needed for leg swelling which is moderate to severe. Using sterile technique, I injected the left subacromial space with a mixture of 2 mL of lidocaine, 2 mL of Marcaine, and 2 mL of 40  mg per mL Kenalog. The patient tolerated the procedure well without complication.

## 2014-04-28 LAB — TSH: TSH: 2.793 u[IU]/mL (ref 0.350–4.500)

## 2014-05-03 ENCOUNTER — Encounter: Payer: Self-pay | Admitting: Family Medicine

## 2014-08-20 ENCOUNTER — Other Ambulatory Visit: Payer: Self-pay | Admitting: Family Medicine

## 2014-08-20 NOTE — Telephone Encounter (Signed)
Medication refilled per protocol. 

## 2014-09-29 ENCOUNTER — Other Ambulatory Visit: Payer: Self-pay | Admitting: Family Medicine

## 2014-11-28 ENCOUNTER — Other Ambulatory Visit: Payer: Self-pay | Admitting: Family Medicine

## 2014-11-29 NOTE — Telephone Encounter (Signed)
Refill appropriate and filled per protocol. 

## 2014-12-25 ENCOUNTER — Encounter: Payer: Self-pay | Admitting: Family Medicine

## 2014-12-25 ENCOUNTER — Ambulatory Visit (INDEPENDENT_AMBULATORY_CARE_PROVIDER_SITE_OTHER): Payer: 59 | Admitting: Family Medicine

## 2014-12-25 VITALS — BP 122/70 | HR 78 | Temp 98.6°F | Resp 16 | Ht 65.0 in | Wt 246.0 lb

## 2014-12-25 DIAGNOSIS — J069 Acute upper respiratory infection, unspecified: Secondary | ICD-10-CM

## 2014-12-25 DIAGNOSIS — R05 Cough: Secondary | ICD-10-CM | POA: Diagnosis not present

## 2014-12-25 DIAGNOSIS — I10 Essential (primary) hypertension: Secondary | ICD-10-CM

## 2014-12-25 DIAGNOSIS — R053 Chronic cough: Secondary | ICD-10-CM

## 2014-12-25 DIAGNOSIS — E039 Hypothyroidism, unspecified: Secondary | ICD-10-CM | POA: Insufficient documentation

## 2014-12-25 DIAGNOSIS — E038 Other specified hypothyroidism: Secondary | ICD-10-CM | POA: Diagnosis not present

## 2014-12-25 MED ORDER — AZITHROMYCIN 250 MG PO TABS: ORAL_TABLET | ORAL | Status: DC

## 2014-12-25 MED ORDER — HYDROCOD POLST-CPM POLST ER 10-8 MG/5ML PO SUER: 5.0000 mL | Freq: Two times a day (BID) | ORAL | Status: DC

## 2014-12-25 MED ORDER — LOSARTAN POTASSIUM 25 MG PO TABS: 25.0000 mg | ORAL_TABLET | Freq: Every day | ORAL | Status: DC
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
## 2014-12-25 NOTE — Patient Instructions (Signed)
Stop the lisinopril Start losartan  We will call with thyroid labs Cough medicine and antibiotics F/U 3 months Dr. Tanya NonesPickard

## 2014-12-25 NOTE — Assessment & Plan Note (Signed)
Trial of losartan

## 2014-12-26 LAB — T4, FREE: Free T4: 0.88 ng/dL (ref 0.80–1.80)

## 2014-12-26 LAB — BASIC METABOLIC PANEL
BUN: 13 mg/dL (ref 7–25)
CALCIUM: 9.8 mg/dL (ref 8.6–10.4)
CO2: 29 mmol/L (ref 20–31)
CREATININE: 0.76 mg/dL (ref 0.50–1.05)
Chloride: 103 mmol/L (ref 98–110)
Glucose, Bld: 83 mg/dL (ref 70–99)
Potassium: 4.6 mmol/L (ref 3.5–5.3)
Sodium: 139 mmol/L (ref 135–146)

## 2014-12-26 LAB — T3, FREE: T3 FREE: 3.1 pg/mL (ref 2.3–4.2)

## 2014-12-26 LAB — TSH: TSH: 3.798 u[IU]/mL (ref 0.350–4.500)

## 2015-04-02 ENCOUNTER — Other Ambulatory Visit: Payer: Self-pay | Admitting: Family Medicine

## 2015-04-02 NOTE — Telephone Encounter (Signed)
Refill appropriate and filled per protocol. 

## 2015-04-08 ENCOUNTER — Encounter: Payer: Self-pay | Admitting: Family Medicine

## 2015-04-08 ENCOUNTER — Ambulatory Visit (INDEPENDENT_AMBULATORY_CARE_PROVIDER_SITE_OTHER): Payer: 59 | Admitting: Family Medicine

## 2015-04-08 VITALS — BP 132/90 | HR 72 | Temp 98.5°F | Resp 16 | Ht 65.0 in | Wt 244.0 lb

## 2015-04-08 DIAGNOSIS — E038 Other specified hypothyroidism: Secondary | ICD-10-CM | POA: Diagnosis not present

## 2015-04-08 DIAGNOSIS — Z0000 Encounter for general adult medical examination without abnormal findings: Secondary | ICD-10-CM | POA: Diagnosis not present

## 2015-04-08 DIAGNOSIS — G629 Polyneuropathy, unspecified: Secondary | ICD-10-CM

## 2015-04-08 DIAGNOSIS — I10 Essential (primary) hypertension: Secondary | ICD-10-CM

## 2015-04-08 LAB — COMPLETE METABOLIC PANEL WITH GFR
ALBUMIN: 4.2 g/dL (ref 3.6–5.1)
ALT: 29 U/L (ref 6–29)
AST: 24 U/L (ref 10–35)
Alkaline Phosphatase: 72 U/L (ref 33–130)
BUN: 16 mg/dL (ref 7–25)
CHLORIDE: 104 mmol/L (ref 98–110)
CO2: 28 mmol/L (ref 20–31)
CREATININE: 0.77 mg/dL (ref 0.50–1.05)
Calcium: 9.6 mg/dL (ref 8.6–10.4)
GFR, Est Non African American: 88 mL/min (ref >=60)
Glucose, Bld: 98 mg/dL (ref 70–99)
POTASSIUM: 4.4 mmol/L (ref 3.5–5.3)
SODIUM: 139 mmol/L (ref 135–146)
Total Bilirubin: 0.4 mg/dL (ref 0.2–1.2)
Total Protein: 7.7 g/dL (ref 6.1–8.1)

## 2015-04-08 LAB — VITAMIN B12: VITAMIN B 12: 631 pg/mL (ref 200–1100)

## 2015-04-08 LAB — CBC WITH DIFFERENTIAL/PLATELET
BASOS ABS: 0 10*3/uL (ref 0.0–0.1)
Basophils Relative: 0 % (ref 0–1)
EOS ABS: 0.2 10*3/uL (ref 0.0–0.7)
Eosinophils Relative: 3 % (ref 0–5)
HEMATOCRIT: 41.1 % (ref 36.0–46.0)
Hemoglobin: 13.5 g/dL (ref 12.0–15.0)
LYMPHS ABS: 2 10*3/uL (ref 0.7–4.0)
LYMPHS PCT: 34 % (ref 12–46)
MCH: 28.2 pg (ref 26.0–34.0)
MCHC: 32.8 g/dL (ref 30.0–36.0)
MCV: 85.8 fL (ref 78.0–100.0)
MPV: 10.1 fL (ref 8.6–12.4)
Monocytes Absolute: 0.6 10*3/uL (ref 0.1–1.0)
Monocytes Relative: 11 % (ref 3–12)
NEUTROS PCT: 52 % (ref 43–77)
Neutro Abs: 3.1 10*3/uL (ref 1.7–7.7)
Platelets: 276 10*3/uL (ref 150–400)
RBC: 4.79 MIL/uL (ref 3.87–5.11)
RDW: 14.3 % (ref 11.5–15.5)
WBC: 5.9 10*3/uL (ref 4.0–10.5)

## 2015-04-08 LAB — LIPID PANEL
CHOLESTEROL: 175 mg/dL (ref 125–200)
HDL: 51 mg/dL (ref >=46)
LDL Cholesterol: 108 mg/dL (ref <130)
TRIGLYCERIDES: 81 mg/dL (ref <150)
Total CHOL/HDL Ratio: 3.4 Ratio (ref <=5.0)
VLDL: 16 mg/dL (ref <30)

## 2015-04-08 LAB — TSH: TSH: 3.68 mIU/L

## 2015-04-08 MED ORDER — GABAPENTIN 300 MG PO CAPS: 300.0000 mg | ORAL_CAPSULE | Freq: Three times a day (TID) | ORAL | Status: DC

## 2015-04-08 NOTE — Progress Notes (Signed)
Subjective:    Patient ID: Diana Hamilton, female    DOB: 1960/10/09, 55 y.o.   MRN: [id number]  HPI Here for cpe.  Does not require pap smear because of her PMH of hysterectomy for non cancerous lesions.  She is overdue for her mammogram but she would like to schedule this with her gynecologist. She is also been complaining of sharp sudden pain in the dorsums of both feet. The pain is located near or clump of varicose veins but it does not sound vascular in lesion. There is some burning stinging intense quality to the pain and it only lasts for a few seconds. She also has symptoms of restless leg syndrome and neuropathic pain in her legs whenever her legs are still. It sounds like she is developing neuropathy in her legs. Last colonoscopy was in 2014 Mammogram- Immunization History  Administered Date(s) Administered  . Hepatitis A 07/03/2005  . Tdap 08/08/2010  . Typhoid Inactivated 07/03/2005    Past Medical History  Diagnosis Date  . Positive ANA (antinuclear antibody)   . DDD (degenerative disc disease)   . Hypertension   . Chronic venous insufficiency    Past Surgical History  Procedure Laterality Date  . Abdominal hysterectomy  01/2006    fibroids   Current Outpatient Prescriptions on File Prior to Visit  Medication Sig Dispense Refill  . azithromycin (ZITHROMAX) 250 MG tablet Take 2 tablets x 1 day, then 1 tab daily for 4 days 6 tablet 0  . chlorpheniramine-HYDROcodone (TUSSIONEX PENNKINETIC ER) 10-8 MG/5ML SUER Take 5 mLs by mouth 2 (two) times daily. 140 mL 0  . furosemide (LASIX) 20 MG tablet TAKE 1 TABLET(20 MG) BY MOUTH DAILY AS NEEDED FOR SWELLING 30 tablet 0  . levothyroxine (SYNTHROID, LEVOTHROID) 25 MCG tablet TAKE 1 TABLET BY MOUTH DAILY BEFORE BREAKFAST 30 tablet 5  . losartan (COZAAR) 25 MG tablet Take 1 tablet (25 mg total) by mouth daily. 30 tablet 3   No current facility-administered medications on file prior to visit.   Allergies  Allergen Reactions  .  Sulfa Antibiotics    Social History   Social History  . Marital Status: Married    Spouse Name: N/A  . Number of Children: N/A  . Years of Education: N/A   Occupational History  . Not on file.   Social History Main Topics  . Smoking status: Never Smoker   . Smokeless tobacco: Never Used  . Alcohol Use: No  . Drug Use: No  . Sexual Activity: Not on file   Other Topics Concern  . Not on file   Social History Narrative   Family History  Problem Relation Age of Onset  . Heart disease Mother     arrhythmia  . Cancer Mother 44    breast  . Hypertension Father   . Stroke Father   . Cancer Sister 47    breast     Review of Systems  All other systems reviewed and are negative.      Objective:   Physical Exam  Constitutional: She is oriented to person, place, and time. She appears well-developed and well-nourished. No distress.  HENT:  Head: Normocephalic and atraumatic.  Right Ear: External ear normal.  Left Ear: External ear normal.  Nose: Nose normal.  Mouth/Throat: Oropharynx is clear and moist. No oropharyngeal exudate.  Eyes: Conjunctivae and EOM are normal. Pupils are equal, round, and reactive to light. Right eye exhibits no discharge. Left eye exhibits no discharge. No scleral  icterus.  Neck: Normal range of motion. Neck supple. No JVD present. No tracheal deviation present. No thyromegaly present.  Cardiovascular: Normal rate, regular rhythm, normal heart sounds and intact distal pulses.  Exam reveals no friction rub.   No murmur heard. Pulmonary/Chest: Effort normal and breath sounds normal. No stridor. No respiratory distress. She has no wheezes. She has no rales. She exhibits no tenderness.  Abdominal: Soft. Bowel sounds are normal. She exhibits no distension and no mass. There is no tenderness. There is no rebound and no guarding.  Musculoskeletal: Normal range of motion. She exhibits no edema or tenderness.  Lymphadenopathy:    She has no cervical  adenopathy.  Neurological: She is alert and oriented to person, place, and time. She has normal reflexes. She displays normal reflexes. No cranial nerve deficit. She exhibits normal muscle tone. Coordination normal.  Skin: Skin is warm. No rash noted. She is not diaphoretic. No erythema. No pallor.  Psychiatric: She has a normal mood and affect. Her behavior is normal. Judgment and thought content normal.  Vitals reviewed.         Assessment & Plan:  Routine general medical examination at a health care facility - Plan: CBC with Differential/Platelet, COMPLETE METABOLIC PANEL WITH GFR, Lipid panel  Other specified hypothyroidism - Plan: TSH  Benign essential HTN  Neuropathy (HCC) - Plan: gabapentin (NEURONTIN) 300 MG capsule, Vitamin B12  Blood pressure is borderline today. However the patient has not taken her medication yet. I will check a CBC, CMP, fasting lipid panel, and a TSH. Based on the neuropathic pain she's been having I will also check a vitamin B12. Offered a flu shot but she politely declined. I recommended that she schedule a mammogram with her gynecologist. I also believe she is developing neuropathy. I will start the patient on gabapentin 300 mg every 8 hours as needed for neuropathic pain. Also recommended diet exercise and weight loss.

## 2015-04-10 ENCOUNTER — Encounter: Payer: Self-pay | Admitting: Family Medicine

## 2015-04-11 ENCOUNTER — Ambulatory Visit (INDEPENDENT_AMBULATORY_CARE_PROVIDER_SITE_OTHER): Payer: 59 | Admitting: Family Medicine

## 2015-04-11 DIAGNOSIS — Z23 Encounter for immunization: Secondary | ICD-10-CM | POA: Diagnosis not present

## 2015-04-15 ENCOUNTER — Other Ambulatory Visit: Payer: Self-pay | Admitting: Family Medicine

## 2015-05-02 ENCOUNTER — Other Ambulatory Visit: Payer: Self-pay | Admitting: Family Medicine

## 2015-06-02 ENCOUNTER — Other Ambulatory Visit: Payer: Self-pay | Admitting: Family Medicine

## 2015-08-08 ENCOUNTER — Other Ambulatory Visit: Payer: Self-pay | Admitting: Family Medicine

## 2016-04-08 ENCOUNTER — Other Ambulatory Visit: Payer: 59

## 2016-04-08 DIAGNOSIS — Z0000 Encounter for general adult medical examination without abnormal findings: Secondary | ICD-10-CM

## 2016-04-08 DIAGNOSIS — I10 Essential (primary) hypertension: Secondary | ICD-10-CM

## 2016-04-08 DIAGNOSIS — E038 Other specified hypothyroidism: Secondary | ICD-10-CM

## 2016-04-09 ENCOUNTER — Ambulatory Visit (INDEPENDENT_AMBULATORY_CARE_PROVIDER_SITE_OTHER): Payer: 59 | Admitting: Family Medicine

## 2016-04-09 ENCOUNTER — Encounter: Payer: Self-pay | Admitting: Family Medicine

## 2016-04-09 ENCOUNTER — Ambulatory Visit (INDEPENDENT_AMBULATORY_CARE_PROVIDER_SITE_OTHER): Payer: 59 | Admitting: *Deleted

## 2016-04-09 VITALS — BP 156/94 | HR 76 | Temp 98.7°F | Resp 18 | Ht 65.0 in | Wt 256.0 lb

## 2016-04-09 DIAGNOSIS — Z23 Encounter for immunization: Secondary | ICD-10-CM

## 2016-04-09 DIAGNOSIS — I10 Essential (primary) hypertension: Secondary | ICD-10-CM

## 2016-04-09 DIAGNOSIS — E038 Other specified hypothyroidism: Secondary | ICD-10-CM

## 2016-04-09 DIAGNOSIS — R8290 Unspecified abnormal findings in urine: Secondary | ICD-10-CM | POA: Diagnosis not present

## 2016-04-09 DIAGNOSIS — Z0000 Encounter for general adult medical examination without abnormal findings: Secondary | ICD-10-CM

## 2016-04-09 DIAGNOSIS — R0600 Dyspnea, unspecified: Secondary | ICD-10-CM

## 2016-04-09 DIAGNOSIS — Z1239 Encounter for other screening for malignant neoplasm of breast: Secondary | ICD-10-CM

## 2016-04-09 DIAGNOSIS — R0609 Other forms of dyspnea: Secondary | ICD-10-CM

## 2016-04-09 DIAGNOSIS — Z1231 Encounter for screening mammogram for malignant neoplasm of breast: Secondary | ICD-10-CM | POA: Diagnosis not present

## 2016-04-09 LAB — CBC WITH DIFFERENTIAL/PLATELET
Basophils Absolute: 76 cells/uL (ref 0–200)
Basophils Relative: 1 %
EOS ABS: 152 {cells}/uL (ref 15–500)
Eosinophils Relative: 2 %
HCT: 39.4 % (ref 35.0–45.0)
Hemoglobin: 12.7 g/dL (ref 12.0–15.0)
LYMPHS PCT: 29 %
Lymphs Abs: 2204 cells/uL (ref 850–3900)
MCH: 27.9 pg (ref 27.0–33.0)
MCHC: 32.2 g/dL (ref 32.0–36.0)
MCV: 86.4 fL (ref 80.0–100.0)
MPV: 10.4 fL (ref 7.5–12.5)
Monocytes Absolute: 532 cells/uL (ref 200–950)
Monocytes Relative: 7 %
NEUTROS PCT: 61 %
Neutro Abs: 4636 cells/uL (ref 1500–7800)
Platelets: 291 10*3/uL (ref 140–400)
RBC: 4.56 MIL/uL (ref 3.80–5.10)
RDW: 14.6 % (ref 11.0–15.0)
WBC: 7.6 10*3/uL (ref 3.8–10.8)

## 2016-04-09 LAB — URINALYSIS, MICROSCOPIC ONLY
CASTS: NONE SEEN [LPF]
Crystals: NONE SEEN [HPF]
RBC / HPF: NONE SEEN RBC/HPF (ref <=2)
Yeast: NONE SEEN [HPF]

## 2016-04-09 LAB — COMPREHENSIVE METABOLIC PANEL
ALK PHOS: 73 U/L (ref 33–130)
ALT: 34 U/L — ABNORMAL HIGH (ref 6–29)
AST: 35 U/L (ref 10–35)
Albumin: 4.1 g/dL (ref 3.6–5.1)
BUN: 21 mg/dL (ref 7–25)
CALCIUM: 9.7 mg/dL (ref 8.6–10.4)
CO2: 24 mmol/L (ref 20–31)
Chloride: 106 mmol/L (ref 98–110)
Creat: 0.75 mg/dL (ref 0.50–1.05)
GLUCOSE: 78 mg/dL (ref 70–99)
POTASSIUM: 4.1 mmol/L (ref 3.5–5.3)
Sodium: 140 mmol/L (ref 135–146)
Total Bilirubin: 0.6 mg/dL (ref 0.2–1.2)
Total Protein: 8 g/dL (ref 6.1–8.1)

## 2016-04-09 LAB — TSH: TSH: 2.67 m[IU]/L

## 2016-04-09 LAB — LIPID PANEL
CHOL/HDL RATIO: 3.3 ratio (ref <5.0)
Cholesterol: 176 mg/dL (ref <200)
HDL: 54 mg/dL (ref >50)
LDL CALC: 110 mg/dL — AB (ref <100)
Triglycerides: 61 mg/dL (ref <150)
VLDL: 12 mg/dL (ref <30)

## 2016-04-09 LAB — URINALYSIS, ROUTINE W REFLEX MICROSCOPIC
BILIRUBIN URINE: NEGATIVE
GLUCOSE, UA: NEGATIVE
Hgb urine dipstick: NEGATIVE
Ketones, ur: NEGATIVE
Leukocytes, UA: NEGATIVE
Nitrite: POSITIVE — AB
PROTEIN: NEGATIVE
Specific Gravity, Urine: 1.025 (ref 1.001–1.035)
pH: 6 (ref 5.0–8.0)

## 2016-04-09 NOTE — Progress Notes (Signed)
Subjective:    Patient ID: Diana Hamilton, female    DOB: 1960-12-05, 56 y.o.   MRN: [id number]  HPI Here for cpe.  Does not require pap smear because of her PMH of hysterectomy for non cancerous lesions.  Last colonoscopy was in 2014. She is long overdue for mammogram. Her blood pressure today is high at 156/94. Patient continues to struggle with her weight. She walks 14,000 steps a day at work however she does not get any regular vigorous aerobic exercise. In fact recently she's become progressively more short of breath with exertion. She states she becomes easily winded just walking up a flight of steps. She denies any chest pain. She denies any chest pressure. She does have a long-standing history of hypertension. Her cholesterol is excellent. However she does have a family history of cardiovascular disease. In fact she states that her mother has been using nitroglycerin since her 60s for angina. Her father had strokes in his 54s and 91s. The dyspnea on exertion is concerning because I want to have the patient engage in a vigorous aerobic exercise program for her weight however I believe she needs a plain old exercise treadmill test prior to medical clearance for an exercise program because of the dyspnea on exertion Immunization History  Administered Date(s) Administered  . Hepatitis A 07/03/2005  . Influenza,inj,Quad PF,36+ Mos 04/11/2015  . Tdap 08/08/2010  . Typhoid Inactivated 07/03/2005    Past Medical History:  Diagnosis Date  . Chronic venous insufficiency   . DDD (degenerative disc disease)   . Hypertension   . Positive ANA (antinuclear antibody)    Past Surgical History:  Procedure Laterality Date  . ABDOMINAL HYSTERECTOMY  01/2006   fibroids   Current Outpatient Prescriptions on File Prior to Visit  Medication Sig Dispense Refill  . furosemide (LASIX) 20 MG tablet TAKE 1 TABLET(20 MG) BY MOUTH DAILY AS NEEDED FOR SWELLING 30 tablet 3  . levothyroxine (SYNTHROID,  LEVOTHROID) 25 MCG tablet TAKE 1 TABLET BY MOUTH DAILY BEFORE BREAKFAST 30 tablet 11  . losartan (COZAAR) 25 MG tablet TAKE 1 TABLET BY MOUTH DAILY 30 tablet 11   No current facility-administered medications on file prior to visit.    Allergies  Allergen Reactions  . Sulfa Antibiotics    Social History   Social History  . Marital status: Married    Spouse name: N/A  . Number of children: N/A  . Years of education: N/A   Occupational History  . Not on file.   Social History Main Topics  . Smoking status: Never Smoker  . Smokeless tobacco: Never Used  . Alcohol use No  . Drug use: No  . Sexual activity: Not on file   Other Topics Concern  . Not on file   Social History Narrative  . No narrative on file   Family History  Problem Relation Age of Onset  . Heart disease Mother     arrhythmia  . Cancer Mother 5    breast  . Hypertension Father   . Stroke Father   . Cancer Sister 59    breast     Review of Systems  All other systems reviewed and are negative.      Objective:   Physical Exam  Constitutional: She is oriented to person, place, and time. She appears well-developed and well-nourished. No distress.  HENT:  Head: Normocephalic and atraumatic.  Right Ear: External ear normal.  Left Ear: External ear normal.  Nose: Nose normal.  Mouth/Throat: Oropharynx is clear and moist. No oropharyngeal exudate.  Eyes: Conjunctivae and EOM are normal. Pupils are equal, round, and reactive to light. Right eye exhibits no discharge. Left eye exhibits no discharge. No scleral icterus.  Neck: Normal range of motion. Neck supple. No JVD present. No tracheal deviation present. No thyromegaly present.  Cardiovascular: Normal rate, regular rhythm, normal heart sounds and intact distal pulses.  Exam reveals no friction rub.   No murmur heard. Pulmonary/Chest: Effort normal and breath sounds normal. No stridor. No respiratory distress. She has no wheezes. She has no rales.  She exhibits no tenderness.  Abdominal: Soft. Bowel sounds are normal. She exhibits no distension and no mass. There is no tenderness. There is no rebound and no guarding.  Musculoskeletal: Normal range of motion. She exhibits no edema or tenderness.  Lymphadenopathy:    She has no cervical adenopathy.  Neurological: She is alert and oriented to person, place, and time. She has normal reflexes. No cranial nerve deficit. She exhibits normal muscle tone. Coordination normal.  Skin: Skin is warm. No rash noted. She is not diaphoretic. No erythema. No pallor.  Psychiatric: She has a normal mood and affect. Her behavior is normal. Judgment and thought content normal.  Vitals reviewed.         Assessment & Plan:  Bad odor of urine - Plan: Urinalysis, Routine w reflex microscopic  Routine general medical examination at a health care facility  Benign essential HTN  Other specified hypothyroidism  Essential hypertension  Dyspnea on exertion - Plan: Ambulatory referral to Cardiology  Breast cancer screening - Plan: MM Digital Screening  Patient's blood pressure is too high. I have asked her to check her blood pressure everyday for the next week and notify me of the values. Goal blood pressures less than 140/90. If not at goal, I will discontinue Lasix and start the patient on daily hydrochlorothiazide in addition to her losartan. Her lab work looks relatively good.  I've asked the patient to see a cardiologist for an exercise treadmill test because of her dyspnea on exertion prior to engaging in vigorous aerobic exercise. I will also schedule the patient for mammogram.

## 2016-04-13 ENCOUNTER — Emergency Department (HOSPITAL_COMMUNITY)
Admission: EM | Admit: 2016-04-13 | Discharge: 2016-04-13 | Disposition: A | Discharge status: Home / Self Care | Payer: Worker's Compensation | Attending: Emergency Medicine | Admitting: Emergency Medicine

## 2016-04-13 ENCOUNTER — Encounter (HOSPITAL_COMMUNITY): Payer: Self-pay | Admitting: Emergency Medicine

## 2016-04-13 DIAGNOSIS — Y9389 Activity, other specified: Secondary | ICD-10-CM | POA: Diagnosis not present

## 2016-04-13 DIAGNOSIS — Y990 Civilian activity done for income or pay: Secondary | ICD-10-CM | POA: Diagnosis not present

## 2016-04-13 DIAGNOSIS — W458XXA Other foreign body or object entering through skin, initial encounter: Secondary | ICD-10-CM | POA: Insufficient documentation

## 2016-04-13 DIAGNOSIS — I10 Essential (primary) hypertension: Secondary | ICD-10-CM | POA: Diagnosis not present

## 2016-04-13 DIAGNOSIS — S60458A Superficial foreign body of other finger, initial encounter: Secondary | ICD-10-CM

## 2016-04-13 DIAGNOSIS — Y929 Unspecified place or not applicable: Secondary | ICD-10-CM | POA: Insufficient documentation

## 2016-04-13 DIAGNOSIS — S60453A Superficial foreign body of left middle finger, initial encounter: Secondary | ICD-10-CM | POA: Insufficient documentation

## 2016-04-13 DIAGNOSIS — Z23 Encounter for immunization: Secondary | ICD-10-CM | POA: Insufficient documentation

## 2016-04-13 DIAGNOSIS — E039 Hypothyroidism, unspecified: Secondary | ICD-10-CM | POA: Diagnosis not present

## 2016-04-13 MED ORDER — CEPHALEXIN 500 MG PO CAPS: 500.0000 mg | ORAL_CAPSULE | Freq: Four times a day (QID) | ORAL | 0 refills | Status: DC

## 2016-04-13 MED ORDER — TETANUS-DIPHTH-ACELL PERTUSSIS 5-2.5-18.5 LF-MCG/0.5 IM SUSP
0.5000 mL | Freq: Once | INTRAMUSCULAR | Status: AC
  Administered 2016-04-13: 0.5 mL via INTRAMUSCULAR
  Filled 2016-04-13: qty 0.5

## 2016-04-13 MED ORDER — LIDOCAINE HCL (PF) 1 % IJ SOLN
5.0000 mL | Freq: Once | INTRAMUSCULAR | Status: AC
  Administered 2016-04-13: 5 mL via INTRADERMAL
  Filled 2016-04-13: qty 30

## 2016-04-13 NOTE — ED Provider Notes (Signed)
[facility]-EMERGENCY DEPT Provider Note   CSN: [id number] Arrival date & time: 04/13/16  4098     History   Chief Complaint Chief Complaint  Patient presents with  . foreign object stuck in finger    HPI Diana Hamilton is a 56 y.o. female.  The history is provided by the patient and medical records.    56 y.o. F with hx of DDD, HTN, chronic venous insufficiency, presenting to the ED with FB stuck in her left middle finger.  Patient reports she was at work and got a hooking apparatus for yarn stuck in her finger.  States she has not tried to pull it out.  States it is about 1cm imbedded.  She denies numbness or weakness of the affected finger.  Last tetanus 2007.    Past Medical History:  Diagnosis Date  . Chronic venous insufficiency   . DDD (degenerative disc disease)   . Hypertension   . Positive ANA (antinuclear antibody)     Patient Active Problem List   Diagnosis Date Noted  . Hypothyroidism 12/25/2014  . Positive ANA (antinuclear antibody)   . DDD (degenerative disc disease)   . Hypertension   . Chronic venous insufficiency     Past Surgical History:  Procedure Laterality Date  . ABDOMINAL HYSTERECTOMY  01/2006   fibroids    OB History    No data available       Home Medications    Prior to Admission medications   Medication Sig Start Date End Date Taking? Authorizing Provider  furosemide (LASIX) 20 MG tablet TAKE 1 TABLET(20 MG) BY MOUTH DAILY AS NEEDED FOR SWELLING 08/09/15   Donita Brooks, MD  levothyroxine (SYNTHROID, LEVOTHROID) 25 MCG tablet TAKE 1 TABLET BY MOUTH DAILY BEFORE BREAKFAST 04/15/15   Donita Brooks, MD  losartan (COZAAR) 25 MG tablet TAKE 1 TABLET BY MOUTH DAILY 06/03/15   Donita Brooks, MD    Family History Family History  Problem Relation Age of Onset  . Heart disease Mother     arrhythmia  . Cancer Mother 49    breast  . Hypertension Father   . Stroke Father   . Cancer Sister 97    breast    Social History Social  History  Substance Use Topics  . Smoking status: Never Smoker  . Smokeless tobacco: Never Used  . Alcohol use No     Allergies   Sulfa antibiotics   Review of Systems Review of Systems  Skin: Positive for wound.  All other systems reviewed and are negative.    Physical Exam Updated Vital Signs BP (!) 153/106 (BP Location: Right Arm)   Pulse 88   Temp 98.4 F (36.9 C) (Oral)   Resp 18   Wt 116.1 kg   SpO2 98%   BMI 42.60 kg/m   Physical Exam  Constitutional: She is oriented to person, place, and time. She appears well-developed and well-nourished.  HENT:  Head: Normocephalic and atraumatic.  Mouth/Throat: Oropharynx is clear and moist.  Eyes: Conjunctivae and EOM are normal. Pupils are equal, round, and reactive to light.  Neck: Normal range of motion.  Cardiovascular: Normal rate, regular rhythm and normal heart sounds.   Pulmonary/Chest: Effort normal and breath sounds normal.  Abdominal: Soft. Bowel sounds are normal.  Musculoskeletal: Normal range of motion.  Yarn needle attached to very small plastic handle embedded in soft tissue of the distal phalanx; does not appear through and through; no nail damage  Neurological: She is  alert and oriented to person, place, and time.  Skin: Skin is warm and dry.  Psychiatric: She has a normal mood and affect.  Nursing note and vitals reviewed.    ED Treatments / Results  Labs (all labs ordered are listed, but only abnormal results are displayed) Labs Reviewed - No data to display  EKG  EKG Interpretation None       Radiology No results found.  Procedures .Foreign Body Removal Date/Time: 04/13/2016 11:06 AM Performed by: Garlon HatchetSANDERS, Maxamilian Amadon M Authorized by: Garlon HatchetSANDERS, Kierrah Kilbride M  Consent: Verbal consent obtained. Risks and benefits: risks, benefits and alternatives were discussed Consent given by: patient Patient understanding: patient states understanding of the procedure being performed Patient identity confirmed:  verbally with patient Time out: Immediately prior to procedure a "time out" was called to verify the correct patient, procedure, equipment, support staff and site/side marked as required. Body area: skin General location: upper extremity Location details: left long finger Anesthesia: digital block  Anesthesia: Local Anesthetic: lidocaine 1% without epinephrine Anesthetic total: 4 mL  Sedation: Patient sedated: no Patient restrained: no Localization method: visualized Tendon involvement: none Depth: subcutaneous Complexity: simple 1 objects recovered. Objects recovered: yarn needle Post-procedure assessment: foreign body removed Patient tolerance: Patient tolerated the procedure well with no immediate complications   (including critical care time)  Medications Ordered in ED Medications  lidocaine (PF) (XYLOCAINE) 1 % injection 5 mL (5 mLs Intradermal Given 04/13/16 1027)  Tdap (BOOSTRIX) injection 0.5 mL (0.5 mLs Intramuscular Given 04/13/16 1025)     Initial Impression / Assessment and Plan / ED Course  I have reviewed the triage vital signs and the nursing notes.  Pertinent labs & imaging results that were available during my care of the patient were reviewed by me and considered in my medical decision making (see chart for details).  56 y.o. F here with yarn needle embedded in left middle finger. Coworker has brought similar needle for depth comparison, appears to be embedded 1 cm or less. There is no apparent bone or tendon involvement. Finger was digitally blocked and needle was removed without complication. Tetanus was updated. Job-related UDS was obtained prior to discharge. Encouraged home wound care with soap and warm water. She is not diabetic and no hx of improper wound healing.  Wound remains without signs of infection so not feel abx indicated at this time.  Follow-up with PCP if any issues.  Discussed plan with patient, she acknowledged understanding and agreed with plan  of care.  Return precautions given for new or worsening symptoms.  11:26 AM At time of discharge, patient is concerned that there was some substance on the hook when it got embedded on her finger.  She is concerned for infection so have written for keflex.  Again, encouraged PCP follow-up.  Final Clinical Impressions(s) / ED Diagnoses   Final diagnoses:  Acute foreign body of middle finger, initial encounter    New Prescriptions New Prescriptions   No medications on file     Garlon HatchetLisa M Idalee Foxworthy, PA-C 04/13/16 1109    Garlon HatchetLisa M Emanuele Mcwhirter, PA-C 04/13/16 [address]    Yaire Kreher M HermitageSanders, PA-C 04/13/16 1128    Pricilla LovelessScott Goldston, MD 04/13/16 [phone]

## 2016-04-13 NOTE — Discharge Instructions (Signed)
Keep finger clean with soap and warm water. Follow-up with your primary care doctor if any issues. Return here for new concerns.

## 2016-04-13 NOTE — ED Notes (Signed)
Lidocaine at bedside.

## 2016-04-13 NOTE — ED Notes (Signed)
Bed: WTR7 Expected date:  Expected time:  Means of arrival:  Comments: 

## 2016-04-13 NOTE — ED Triage Notes (Signed)
Patient was at work and got hook stuck in left middle finger.

## 2016-04-17 ENCOUNTER — Telehealth: Payer: Self-pay | Admitting: Cardiology

## 2016-04-17 NOTE — Telephone Encounter (Signed)
New message    Pt states she got a phone call with no VM left and doesn't know why anyone called. Calling back.

## 2016-04-17 NOTE — Telephone Encounter (Signed)
Attempted to return call to patient. No answer, no VM, phone just beeps after ringing  No notation in EPIC where someone called patient She has a new patient appt on 04/27/16 - appt made on 04/17/16 AM

## 2016-04-25 ENCOUNTER — Other Ambulatory Visit: Payer: Self-pay | Admitting: Family Medicine

## 2016-04-27 ENCOUNTER — Ambulatory Visit (INDEPENDENT_AMBULATORY_CARE_PROVIDER_SITE_OTHER): Payer: 59 | Admitting: Cardiology

## 2016-04-27 ENCOUNTER — Encounter: Payer: Self-pay | Admitting: Cardiology

## 2016-04-27 VITALS — BP 148/98 | HR 83 | Ht 66.0 in | Wt 254.0 lb

## 2016-04-27 DIAGNOSIS — R0609 Other forms of dyspnea: Secondary | ICD-10-CM | POA: Diagnosis not present

## 2016-04-27 DIAGNOSIS — R0600 Dyspnea, unspecified: Secondary | ICD-10-CM

## 2016-04-27 DIAGNOSIS — I10 Essential (primary) hypertension: Secondary | ICD-10-CM

## 2016-04-27 MED ORDER — LOSARTAN POTASSIUM 50 MG PO TABS: 50.0000 mg | ORAL_TABLET | Freq: Every day | ORAL | 3 refills | Status: DC

## 2016-04-27 MED ORDER — ASPIRIN EC 81 MG PO TBEC: 81.0000 mg | DELAYED_RELEASE_TABLET | Freq: Every day | ORAL | 3 refills | Status: DC

## 2016-04-27 NOTE — Patient Instructions (Signed)
Increase losartan to 50 mg daily  Decrease ASA to 81 mg daily  We will schedule you for an Echocardiogram and a nuclear stress test

## 2016-04-27 NOTE — Progress Notes (Signed)
Cardiology Office Note    Date:  04/27/2016   ID:  Diana Hamilton, DOB 09/15/1960, MRN [id number]  PCP:  Leo GrosserPICKARD,WARREN TOM, MD  Cardiologist:  Peter SwazilandJordan, MD    History of Present Illness:  Diana Hamilton is a 56 y.o. female seen at the request of Dr. Tanya NonesPickard for evaluation of dyspnea on exertion. She has a history of HTN and chronic venous insufficiency. 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 It did improve for a while but since 2008 it has been running higher. 2 years ago she developed hypothyroidism and she reports that after starting thyroid hormone she gained weight. She was also placed on Neurontin for neuropathy which she states also caused weight gain. Altogether she has gained 20 lbs. She reports her BP control is still poor. Readings from home from 132-196 systolic. She does note now when walking long distance, especially up stairs she gets dyspneic. Some chest pressure. She does have a family history of early vascular disease. No history of smoking.     Past Medical History:  Diagnosis Date  . Chronic venous insufficiency   . DDD (degenerative disc disease)   . Hyperlipidemia   . Hypertension   . Peripheral neuropathy (HCC)   . Positive ANA (antinuclear antibody)   . Thyroid disease     Past Surgical History:  Procedure Laterality Date  . ABDOMINAL HYSTERECTOMY  01/2006   fibroids    Current Medications: Outpatient Medications Prior to Visit  Medication Sig Dispense Refill  . levothyroxine (SYNTHROID, LEVOTHROID) 25 MCG tablet TAKE 1 TABLET BY MOUTH DAILY BEFORE BREAKFAST 30 tablet 11  . losartan (COZAAR) 25 MG tablet TAKE 1 TABLET BY MOUTH DAILY 30 tablet 11  . furosemide (LASIX) 20 MG tablet TAKE 1 TABLET(20 MG) BY MOUTH DAILY AS NEEDED FOR SWELLING (Patient not taking: Reported on 04/27/2016) 30 tablet 3  . cephALEXin (KEFLEX) 500 MG capsule Take 1 capsule (500 mg total) by mouth 4 (four) times daily. 40 capsule 0   No  facility-administered medications prior to visit.      Allergies:   Lisinopril and Sulfa antibiotics   Social History   Social History  . Marital status: Married    Spouse name: N/A  . Number of children: 2  . Years of education: N/A   Occupational History  . textile    Social History Main Topics  . Smoking status: Never Smoker  . Smokeless tobacco: Never Used  . Alcohol use No  . Drug use: No  . Sexual activity: Not Asked   Other Topics Concern  . None   Social History Narrative  . None     Family History:  The patient's family history includes Cancer (age of onset: 3843) in her sister; Cancer (age of onset: 6773) in her mother; Heart disease in her mother; Hypertension in her father; Stroke in her father.   ROS:   Please see the history of present illness.    ROS All other systems reviewed and are negative.   PHYSICAL EXAM:   VS:  BP (!) 148/98 Comment: Right arm.  Pulse 83   Ht 5\' 6"  (1.676 m)   Wt 254 lb (115.2 kg)   BMI 41.00 kg/m    GEN: Well nourished, morbidly obese, in no acute distress  HEENT: normal  Neck: no JVD, carotid bruits, or masses Cardiac: RRR; no murmurs, rubs, or gallops,no edema  Respiratory:  clear to auscultation bilaterally, normal work  of breathing GI: soft, nontender, nondistended, + BS MS: no deformity or atrophy  Skin: warm and dry, no rash Neuro:  Alert and Oriented x 3, Strength and sensation are intact Psych: euthymic mood, full affect  Wt Readings from Last 3 Encounters:  04/27/16 254 lb (115.2 kg)  04/13/16 256 lb (116.1 kg)  04/09/16 256 lb (116.1 kg)      Studies/Labs Reviewed:   EKG:  EKG is ordered today.  The ekg ordered today demonstrates NSR rate 83. Incomplete RBBB. I have personally reviewed and interpreted this study.   Recent Labs: 04/08/2016: ALT 34; BUN 21; Creat 0.75; Hemoglobin 12.7; Platelets 291; Potassium 4.1; Sodium 140; TSH 2.67   Lipid Panel    Component Value Date/Time   CHOL 176 04/08/2016  1544   TRIG 61 04/08/2016 1544   HDL 54 04/08/2016 1544   CHOLHDL 3.3 04/08/2016 1544   VLDL 12 04/08/2016 1544   LDLCALC 110 (H) 04/08/2016 1544    Additional studies/ records that were reviewed today include:    ASSESSMENT:    1. Dyspnea on exertion   2. Essential hypertension      PLAN:  In order of problems listed above:  1. I suspect her dyspnea is predominantly related to her obesity and hypertensive heart disease with diastolic dysfunction. Given risk factors need to consider possible ischemia. Will schedule for Echocardiogram and Nuclear stress test. Needs to focus on weight loss and regular aerobic exercise. 2. BP is suboptimally controlled. Will increase losartan to 50 mg daily. Keep BP diary. I anticipate she may need more than one agent to control her BP. Weight loss encouraged. Low sodium diet.     Medication Adjustments/Labs and Tests Ordered: Current medicines are reviewed at length with the patient today.  Concerns regarding medicines are outlined above.  Medication changes, Labs and Tests ordered today are listed in the Patient Instructions below. Patient Instructions  Increase losartan to 50 mg daily  Decrease ASA to 81 mg daily  We will schedule you for an Echocardiogram and a nuclear stress test      Signed, Peter Swaziland, MD  04/27/2016 5:34 PM    [facility] [address] [place], [place], [address] [phone]

## 2016-05-06 ENCOUNTER — Ambulatory Visit: Payer: Self-pay

## 2016-05-07 ENCOUNTER — Ambulatory Visit: Payer: Self-pay

## 2016-05-14 ENCOUNTER — Ambulatory Visit (HOSPITAL_COMMUNITY): Payer: 59 | Attending: Cardiovascular Disease

## 2016-05-14 ENCOUNTER — Other Ambulatory Visit: Payer: Self-pay

## 2016-05-14 DIAGNOSIS — R0600 Dyspnea, unspecified: Secondary | ICD-10-CM

## 2016-05-14 DIAGNOSIS — I517 Cardiomegaly: Secondary | ICD-10-CM | POA: Insufficient documentation

## 2016-05-14 DIAGNOSIS — R0609 Other forms of dyspnea: Secondary | ICD-10-CM | POA: Insufficient documentation

## 2016-05-14 DIAGNOSIS — I10 Essential (primary) hypertension: Secondary | ICD-10-CM

## 2016-05-18 ENCOUNTER — Encounter: Payer: Self-pay | Admitting: Family Medicine

## 2016-06-02 ENCOUNTER — Telehealth (HOSPITAL_COMMUNITY): Payer: Self-pay

## 2016-06-02 NOTE — Telephone Encounter (Signed)
Encounter complete. 

## 2016-06-03 ENCOUNTER — Telehealth (HOSPITAL_COMMUNITY): Payer: Self-pay

## 2016-06-03 NOTE — Telephone Encounter (Signed)
Encounter complete. 

## 2016-06-03 NOTE — Telephone Encounter (Signed)
Detailed instructions left on cell voicemail per verbal authorization from pt. Pt to return call for any questions or concerns.

## 2016-06-04 ENCOUNTER — Ambulatory Visit (HOSPITAL_COMMUNITY)
Admission: RE | Admit: 2016-06-04 | Discharge: 2016-06-04 | Disposition: A | Discharge status: Home / Self Care | Payer: 59 | Source: Ambulatory Visit | Attending: Cardiology | Admitting: Cardiology

## 2016-06-04 DIAGNOSIS — I872 Venous insufficiency (chronic) (peripheral): Secondary | ICD-10-CM | POA: Insufficient documentation

## 2016-06-04 DIAGNOSIS — E669 Obesity, unspecified: Secondary | ICD-10-CM | POA: Insufficient documentation

## 2016-06-04 DIAGNOSIS — I10 Essential (primary) hypertension: Secondary | ICD-10-CM

## 2016-06-04 DIAGNOSIS — Z8249 Family history of ischemic heart disease and other diseases of the circulatory system: Secondary | ICD-10-CM | POA: Insufficient documentation

## 2016-06-04 DIAGNOSIS — I4510 Unspecified right bundle-branch block: Secondary | ICD-10-CM | POA: Insufficient documentation

## 2016-06-04 DIAGNOSIS — R0609 Other forms of dyspnea: Secondary | ICD-10-CM

## 2016-06-04 DIAGNOSIS — E079 Disorder of thyroid, unspecified: Secondary | ICD-10-CM | POA: Insufficient documentation

## 2016-06-04 MED ORDER — TECHNETIUM TC 99M TETROFOSMIN IV KIT
32.0000 | PACK | Freq: Once | INTRAVENOUS | Status: AC | PRN
  Administered 2016-06-04: 32 via INTRAVENOUS
  Filled 2016-06-04: qty 32

## 2016-06-05 ENCOUNTER — Ambulatory Visit (HOSPITAL_COMMUNITY)
Admission: RE | Admit: 2016-06-05 | Discharge: 2016-06-05 | Disposition: A | Discharge status: Home / Self Care | Payer: 59 | Source: Ambulatory Visit | Attending: Cardiovascular Disease | Admitting: Cardiovascular Disease

## 2016-06-05 ENCOUNTER — Other Ambulatory Visit (HOSPITAL_COMMUNITY): Payer: Self-pay

## 2016-06-05 LAB — MYOCARDIAL PERFUSION IMAGING
CHL CUP NUCLEAR SDS: 0
CHL CUP NUCLEAR SRS: 0
CHL CUP NUCLEAR SSS: 0
CHL RATE OF PERCEIVED EXERTION: 17
CSEPED: 6 min
CSEPEW: 8.3 METS
CSEPHR: 101 %
CSEPPHR: 169 {beats}/min
Exercise duration (sec): 52 s
LV dias vol: 84 mL (ref 46–106)
LV sys vol: 24 mL
MPHR: 166 {beats}/min
Rest HR: 75 {beats}/min
TID: 0.61

## 2016-06-05 MED ORDER — TECHNETIUM TC 99M TETROFOSMIN IV KIT
31.8000 | PACK | Freq: Once | INTRAVENOUS | Status: AC | PRN
  Administered 2016-06-05: 31.8 via INTRAVENOUS

## 2016-06-15 ENCOUNTER — Encounter: Payer: Self-pay | Admitting: Family Medicine

## 2016-08-25 ENCOUNTER — Encounter: Payer: Self-pay | Admitting: Family Medicine

## 2016-10-05 ENCOUNTER — Other Ambulatory Visit: Payer: Self-pay | Admitting: Family Medicine

## 2017-02-22 ENCOUNTER — Other Ambulatory Visit: Payer: Self-pay | Admitting: Family Medicine

## 2017-02-22 MED ORDER — LEVOTHYROXINE SODIUM 25 MCG PO TABS: ORAL_TABLET | ORAL | 1 refills | Status: DC

## 2017-02-22 MED ORDER — LOSARTAN POTASSIUM 50 MG PO TABS: 50.0000 mg | ORAL_TABLET | Freq: Every day | ORAL | 1 refills | Status: DC

## 2017-04-12 ENCOUNTER — Other Ambulatory Visit: Payer: 59

## 2017-04-12 DIAGNOSIS — Z0000 Encounter for general adult medical examination without abnormal findings: Secondary | ICD-10-CM

## 2017-04-12 DIAGNOSIS — Z114 Encounter for screening for human immunodeficiency virus [HIV]: Secondary | ICD-10-CM

## 2017-04-12 DIAGNOSIS — Z1159 Encounter for screening for other viral diseases: Secondary | ICD-10-CM

## 2017-04-12 DIAGNOSIS — E038 Other specified hypothyroidism: Secondary | ICD-10-CM

## 2017-04-12 DIAGNOSIS — I10 Essential (primary) hypertension: Secondary | ICD-10-CM

## 2017-04-12 DIAGNOSIS — Z79899 Other long term (current) drug therapy: Secondary | ICD-10-CM

## 2017-04-13 ENCOUNTER — Other Ambulatory Visit: Payer: Self-pay | Admitting: *Deleted

## 2017-04-13 LAB — CBC WITH DIFFERENTIAL/PLATELET
BASOS ABS: 50 {cells}/uL (ref 0–200)
Basophils Relative: 0.7 %
EOS ABS: 101 {cells}/uL (ref 15–500)
Eosinophils Relative: 1.4 %
HEMATOCRIT: 40.1 % (ref 35.0–45.0)
Hemoglobin: 13.6 g/dL (ref 11.7–15.5)
Lymphs Abs: 2023 cells/uL (ref 850–3900)
MCH: 28.8 pg (ref 27.0–33.0)
MCHC: 33.9 g/dL (ref 32.0–36.0)
MCV: 84.8 fL (ref 80.0–100.0)
MPV: 11.9 fL (ref 7.5–12.5)
Monocytes Relative: 7.1 %
NEUTROS PCT: 62.7 %
Neutro Abs: 4514 cells/uL (ref 1500–7800)
PLATELETS: 272 10*3/uL (ref 140–400)
RBC: 4.73 10*6/uL (ref 3.80–5.10)
RDW: 13 % (ref 11.0–15.0)
Total Lymphocyte: 28.1 %
WBC: 7.2 10*3/uL (ref 3.8–10.8)
WBCMIX: 511 {cells}/uL (ref 200–950)

## 2017-04-13 LAB — COMPREHENSIVE METABOLIC PANEL
AG Ratio: 1.3 (calc) (ref 1.0–2.5)
ALBUMIN MSPROF: 4.4 g/dL (ref 3.6–5.1)
ALKALINE PHOSPHATASE (APISO): 74 U/L (ref 33–130)
ALT: 19 U/L (ref 6–29)
AST: 19 U/L (ref 10–35)
BILIRUBIN TOTAL: 0.4 mg/dL (ref 0.2–1.2)
BUN: 19 mg/dL (ref 7–25)
CALCIUM: 9.7 mg/dL (ref 8.6–10.4)
CO2: 20 mmol/L (ref 20–32)
Chloride: 108 mmol/L (ref 98–110)
Creat: 0.77 mg/dL (ref 0.50–1.05)
Globulin: 3.4 g/dL (calc) (ref 1.9–3.7)
Glucose, Bld: 76 mg/dL (ref 65–99)
POTASSIUM: 4.3 mmol/L (ref 3.5–5.3)
Sodium: 141 mmol/L (ref 135–146)
Total Protein: 7.8 g/dL (ref 6.1–8.1)

## 2017-04-13 LAB — LIPID PANEL
CHOLESTEROL: 175 mg/dL (ref <200)
HDL: 58 mg/dL (ref >50)
LDL CHOLESTEROL (CALC): 103 mg/dL — AB
Non-HDL Cholesterol (Calc): 117 mg/dL (calc) (ref <130)
Total CHOL/HDL Ratio: 3 (calc) (ref <5.0)
Triglycerides: 57 mg/dL (ref <150)

## 2017-04-13 LAB — TSH: TSH: 3.19 mIU/L

## 2017-04-13 LAB — HEPATITIS C ANTIBODY
Hepatitis C Ab: NONREACTIVE
SIGNAL TO CUT-OFF: 0.02 (ref <1.00)

## 2017-04-13 LAB — HIV ANTIBODY (ROUTINE TESTING W REFLEX): HIV 1&2 Ab, 4th Generation: NONREACTIVE

## 2017-04-13 MED ORDER — LOSARTAN POTASSIUM 50 MG PO TABS: 50.0000 mg | ORAL_TABLET | Freq: Every day | ORAL | 0 refills | Status: DC

## 2017-04-19 ENCOUNTER — Encounter: Payer: Self-pay | Admitting: Family Medicine

## 2017-04-19 ENCOUNTER — Ambulatory Visit (INDEPENDENT_AMBULATORY_CARE_PROVIDER_SITE_OTHER): Payer: 59 | Admitting: Family Medicine

## 2017-04-19 VITALS — BP 140/100 | HR 70 | Temp 98.5°F | Resp 14 | Ht 65.0 in | Wt 254.0 lb

## 2017-04-19 DIAGNOSIS — Z0000 Encounter for general adult medical examination without abnormal findings: Secondary | ICD-10-CM

## 2017-04-19 DIAGNOSIS — E039 Hypothyroidism, unspecified: Secondary | ICD-10-CM | POA: Diagnosis not present

## 2017-04-19 DIAGNOSIS — I10 Essential (primary) hypertension: Secondary | ICD-10-CM | POA: Diagnosis not present

## 2017-04-19 MED ORDER — MELOXICAM 15 MG PO TABS: 15.0000 mg | ORAL_TABLET | Freq: Every day | ORAL | 0 refills | Status: DC

## 2017-04-19 MED ORDER — HYDROCHLOROTHIAZIDE 25 MG PO TABS: 25.0000 mg | ORAL_TABLET | Freq: Every day | ORAL | 3 refills | Status: DC

## 2017-04-19 NOTE — Progress Notes (Signed)
Subjective:    Patient ID: Diana Hamilton, female    DOB: 1960/09/10, 57 y.o.   MRN: [id number]  HPI Here for cpe.  Does not require pap smear because of her PMH of hysterectomy for non cancerous lesions.  Last colonoscopy was in 2014. She is long overdue for mammogram. Her blood pressure today is high at 140/100. She is trying a new diet and has lost 20 pounds per her scales at home. Her most recent lab work as listed below. She does complain of bilateral joint line pain in both knees.  Lab on 04/12/2017  Component Date Value Ref Range Status  . WBC 04/12/2017 7.2  3.8 - 10.8 Thousand/uL Final  . RBC 04/12/2017 4.73  3.80 - 5.10 Million/uL Final  . Hemoglobin 04/12/2017 13.6  11.7 - 15.5 g/dL Final  . HCT 40/98/1191 40.1  35.0 - 45.0 % Final  . MCV 04/12/2017 84.8  80.0 - 100.0 fL Final  . MCH 04/12/2017 28.8  27.0 - 33.0 pg Final  . MCHC 04/12/2017 33.9  32.0 - 36.0 g/dL Final  . RDW 47/82/9562 13.0  11.0 - 15.0 % Final  . Platelets 04/12/2017 272  140 - 400 Thousand/uL Final  . MPV 04/12/2017 11.9  7.5 - 12.5 fL Final  . Neutro Abs 04/12/2017 4,514  1,500 - 7,800 cells/uL Final  . Lymphs Abs 04/12/2017 2,023  850 - 3,900 cells/uL Final  . WBC mixed population 04/12/2017 511  200 - 950 cells/uL Final  . Eosinophils Absolute 04/12/2017 101  15 - 500 cells/uL Final  . Basophils Absolute 04/12/2017 50  0 - 200 cells/uL Final  . Neutrophils Relative % 04/12/2017 62.7  % Final  . Total Lymphocyte 04/12/2017 28.1  % Final  . Monocytes Relative 04/12/2017 7.1  % Final  . Eosinophils Relative 04/12/2017 1.4  % Final  . Basophils Relative 04/12/2017 0.7  % Final  . Glucose, Bld 04/12/2017 76  65 - 99 mg/dL Final   Comment: .            Fasting reference interval .   . BUN 04/12/2017 19  7 - 25 mg/dL Final  . Creat 13/09/6576 0.77  0.50 - 1.05 mg/dL Final   Comment: For patients >12 years of age, the reference limit for Creatinine is approximately 13% higher for people identified as  African-American. .   Edwena Felty Ratio 04/12/2017 NOT APPLICABLE  6 - 22 (calc) Final  . Sodium 04/12/2017 141  135 - 146 mmol/L Final  . Potassium 04/12/2017 4.3  3.5 - 5.3 mmol/L Final  . Chloride 04/12/2017 108  98 - 110 mmol/L Final  . CO2 04/12/2017 20  20 - 32 mmol/L Final  . Calcium 04/12/2017 9.7  8.6 - 10.4 mg/dL Final  . Total Protein 04/12/2017 7.8  6.1 - 8.1 g/dL Final  . Albumin 46/96/2952 4.4  3.6 - 5.1 g/dL Final  . Globulin 84/13/2440 3.4  1.9 - 3.7 g/dL (calc) Final  . AG Ratio 04/12/2017 1.3  1.0 - 2.5 (calc) Final  . Total Bilirubin 04/12/2017 0.4  0.2 - 1.2 mg/dL Final  . Alkaline phosphatase (APISO) 04/12/2017 74  33 - 130 U/L Final  . AST 04/12/2017 19  10 - 35 U/L Final  . ALT 04/12/2017 19  6 - 29 U/L Final  . Cholesterol 04/12/2017 175  <200 mg/dL Final  . HDL 12/06/2534 58  >50 mg/dL Final  . Triglycerides 04/12/2017 57  <150 mg/dL Final  . LDL Cholesterol (Calc)  04/12/2017 103* mg/dL (calc) Final   Comment: Reference range: <100 . Desirable range <100 mg/dL for primary prevention;   <70 mg/dL for patients with CHD or diabetic patients  with > or = 2 CHD risk factors. Marland Kitchen LDL-C is now calculated using the Martin-Hopkins  calculation, which is a validated novel method providing  better accuracy than the Friedewald equation in the  estimation of LDL-C.  Horald Pollen et al. Lenox Ahr. 1610;960(45): 2061-2068  (http://education.[facility].com/faq/FAQ164)   . Total CHOL/HDL Ratio 04/12/2017 3.0  <4.0 (calc) Final  . Non-HDL Cholesterol (Calc) 04/12/2017 117  <130 mg/dL (calc) Final   Comment: For patients with diabetes plus 1 major ASCVD risk  factor, treating to a non-HDL-C goal of <100 mg/dL  (LDL-C of <98 mg/dL) is considered a therapeutic  option.   . TSH 04/12/2017 3.19  mIU/L Final   Comment:           Reference Range .           > or = 20 Years  0.40-4.50 .                Pregnancy Ranges           First trimester    0.26-2.66            Second trimester   0.55-2.73           Third trimester    0.43-2.91   . Hepatitis C Ab 04/12/2017 NON-REACTIVE  NON-REACTI Final  . SIGNAL TO CUT-OFF 04/12/2017 0.02  <1.00 Final   Comment: . HCV antibody was non-reactive. There is no laboratory  evidence of HCV infection. . In most cases, no further action is required. However, if recent HCV exposure is suspected, a test for HCV RNA (test code 11914) is suggested. . For additional information please refer to http://education.[facility].com/faq/FAQ22v1 (This link is being provided for informational/ educational purposes only.) .   Marland Kitchen HIV 1&2 Ab, 4th Generation 04/12/2017 NON-REACTIVE  NON-REACTI Final   Comment: HIV-1 antigen and HIV-1/HIV-2 antibodies were not detected. There is no laboratory evidence of HIV infection. Marland Kitchen PLEASE NOTE: This information has been disclosed to you from records whose confidentiality may be protected by state law.  If your state requires such protection, then the state law prohibits you from making any further disclosure of the information without the specific written consent of the person to whom it pertains, or as otherwise permitted by law. A general authorization for the release of medical or other information is NOT sufficient for this purpose. . For additional information please refer to http://education.[facility].com/faq/FAQ106 (This link is being provided for informational/ educational purposes only.) . Marland Kitchen The performance of this assay has not been clinically validated in patients less than 71 years old. .     Immunization History  Administered Date(s) Administered  . Hepatitis A 07/03/2005  . Influenza,inj,Quad PF,6+ Mos 04/11/2015, 04/09/2016  . Tdap 08/08/2010, 04/13/2016  . Typhoid Inactivated 07/03/2005    Past Medical History:  Diagnosis Date  . Chronic venous insufficiency   . DDD (degenerative disc disease)   . Hyperlipidemia   . Hypertension   .  Peripheral neuropathy   . Positive ANA (antinuclear antibody)   . Thyroid disease    Past Surgical History:  Procedure Laterality Date  . ABDOMINAL HYSTERECTOMY  01/2006   fibroids   Current Outpatient Medications on File Prior to Visit  Medication Sig Dispense Refill  . aspirin EC 81 MG tablet Take 1 tablet (81 mg total) by  mouth daily. 90 tablet 3  . levothyroxine (SYNTHROID, LEVOTHROID) 25 MCG tablet TAKE 1 TABLET BY MOUTH DAILY BEFORE BREAKFAST 90 tablet 1   No current facility-administered medications on file prior to visit.    Allergies  Allergen Reactions  . Lisinopril Cough  . Sulfa Antibiotics    Social History   Socioeconomic History  . Marital status: Married    Spouse name: Not on file  . Number of children: 2  . Years of education: Not on file  . Highest education level: Not on file  Social Needs  . Financial resource strain: Not on file  . Food insecurity - worry: Not on file  . Food insecurity - inability: Not on file  . Transportation needs - medical: Not on file  . Transportation needs - non-medical: Not on file  Occupational History  . Occupation: textile  Tobacco Use  . Smoking status: Never Smoker  . Smokeless tobacco: Never Used  Substance and Sexual Activity  . Alcohol use: No  . Drug use: No  . Sexual activity: Not on file  Other Topics Concern  . Not on file  Social History Narrative  . Not on file   Family History  Problem Relation Age of Onset  . Heart disease Mother        arrhythmia  . Cancer Mother [id number]       breast  . Hypertension Father   . Stroke Father   . Cancer Sister [id number]       breast     Review of Systems  All other systems reviewed and are negative.      Objective:   Physical Exam  Constitutional: She is oriented to person, place, and time. She appears well-developed and well-nourished. No distress.  HENT:  Head: Normocephalic and atraumatic.  Right Ear: External ear normal.  Left Ear: External ear normal.    Nose: Nose normal.  Mouth/Throat: Oropharynx is clear and moist. No oropharyngeal exudate.  Eyes: Conjunctivae and EOM are normal. Pupils are equal, round, and reactive to light. Right eye exhibits no discharge. Left eye exhibits no discharge. No scleral icterus.  Neck: Normal range of motion. Neck supple. No JVD present. No tracheal deviation present. No thyromegaly present.  Cardiovascular: Normal rate, regular rhythm, normal heart sounds and intact distal pulses. Exam reveals no friction rub.  No murmur heard. Pulmonary/Chest: Effort normal and breath sounds normal. No stridor. No respiratory distress. She has no wheezes. She has no rales. She exhibits no tenderness.  Abdominal: Soft. Bowel sounds are normal. She exhibits no distension and no mass. There is no tenderness. There is no rebound and no guarding.  Musculoskeletal: Normal range of motion. She exhibits no edema or tenderness.  Lymphadenopathy:    She has no cervical adenopathy.  Neurological: She is alert and oriented to person, place, and time. She has normal reflexes. No cranial nerve deficit. She exhibits normal muscle tone. Coordination normal.  Skin: Skin is warm. No rash noted. She is not diaphoretic. No erythema. No pallor.  Psychiatric: She has a normal mood and affect. Her behavior is normal. Judgment and thought content normal.  Vitals reviewed.         Assessment & Plan:  General medical exam, hypothyroidism, essential hypertension  Physical exam is significant for obesity. Continue to encourage 30 minutes a day 5 days a week of aerobic exercise along with a 1500-calorie a day diet. Recommended meloxicam 15 mg by mouth daily when necessary knee pain. Recommend mammogram patient  would like to schedule this on her own. Colonoscopy is up-to-date and Pap smear is not required. Recommended shingles vaccine. Patient declined flu shot. Hepatitis C and HIV screening are up-to-date. Lab work is excellent. Patient will check  her blood pressure everyday and call me in one week with the values. She believes her blood pressure is elevated due to anxiety today

## 2017-04-20 ENCOUNTER — Other Ambulatory Visit: Payer: Self-pay | Admitting: Family Medicine

## 2017-05-15 ENCOUNTER — Other Ambulatory Visit: Payer: Self-pay | Admitting: Family Medicine

## 2017-09-23 ENCOUNTER — Other Ambulatory Visit: Payer: Self-pay | Admitting: Family Medicine

## 2017-10-18 ENCOUNTER — Encounter: Payer: Self-pay | Admitting: Family Medicine

## 2017-10-18 ENCOUNTER — Ambulatory Visit (INDEPENDENT_AMBULATORY_CARE_PROVIDER_SITE_OTHER): Payer: 59 | Admitting: Family Medicine

## 2017-10-18 VITALS — BP 142/90 | HR 76 | Temp 98.6°F | Resp 16 | Ht 65.0 in | Wt 243.0 lb

## 2017-10-18 DIAGNOSIS — R0789 Other chest pain: Secondary | ICD-10-CM

## 2017-10-18 DIAGNOSIS — M898X1 Other specified disorders of bone, shoulder: Secondary | ICD-10-CM | POA: Diagnosis not present

## 2017-10-18 DIAGNOSIS — E039 Hypothyroidism, unspecified: Secondary | ICD-10-CM | POA: Diagnosis not present

## 2017-10-18 NOTE — Progress Notes (Signed)
Subjective:    Patient ID: Diana Hamilton, female    DOB: 1960-11-09, 57 y.o.   MRN: [id number]  HPI Patient is here today because she is concerned about a lump she feels on the inferior portion of her neck just below the thyroid gland.  There is a hard bony nodule appreciated in that area as gram in the physical exam area.  The area is the proximal portion of the clavicle where it meets the manubrium.  It is tender to palpation in this area and it is asymmetric compared to the left proximal clavicle.  Patient states that it "popped up overnight".  She denies any fall or trauma to the area.  She denies any trouble swallowing.  She denies any fevers chills or weight loss.  She is concerned that it may represent some type of cancer.  There are no pathologic features or findings appreciable on exam.  There is no lymphadenopathy in the neck.  There are no palpable soft tissue masses.  There are no thyroid nodules appreciated. Past Medical History:  Diagnosis Date  . Chronic venous insufficiency   . DDD (degenerative disc disease)   . Hyperlipidemia   . Hypertension   . Peripheral neuropathy   . Positive ANA (antinuclear antibody)   . Thyroid disease    Past Surgical History:  Procedure Laterality Date  . ABDOMINAL HYSTERECTOMY  01/2006   fibroids   Current Outpatient Medications on File Prior to Visit  Medication Sig Dispense Refill  . hydrochlorothiazide (HYDRODIURIL) 25 MG tablet Take 1 tablet (25 mg total) by mouth daily. (Patient taking differently: Take 50 mg by mouth daily. ) 90 tablet 3  . levothyroxine (SYNTHROID, LEVOTHROID) 25 MCG tablet TAKE 1 TABLET BY MOUTH DAILY BEFORE BREAKFAST 90 tablet 1  . medium chain triglycerides (MCT OIL) oil Take by mouth 3 (three) times daily.    . meloxicam (MOBIC) 15 MG tablet TAKE 1 TABLET(15 MG) BY MOUTH DAILY 90 tablet 3   No current facility-administered medications on file prior to visit.    Allergies  Allergen Reactions  . Lisinopril Cough   . Sulfa Antibiotics    Social History   Socioeconomic History  . Marital status: Married    Spouse name: Not on file  . Number of children: 2  . Years of education: Not on file  . Highest education level: Not on file  Occupational History  . Occupation: Financial risk analyst  . Financial resource strain: Not on file  . Food insecurity:    Worry: Not on file    Inability: Not on file  . Transportation needs:    Medical: Not on file    Non-medical: Not on file  Tobacco Use  . Smoking status: Never Smoker  . Smokeless tobacco: Never Used  Substance and Sexual Activity  . Alcohol use: No  . Drug use: No  . Sexual activity: Not on file  Lifestyle  . Physical activity:    Days per week: Not on file    Minutes per session: Not on file  . Stress: Not on file  Relationships  . Social connections:    Talks on phone: Not on file    Gets together: Not on file    Attends religious service: Not on file    Active member of club or organization: Not on file    Attends meetings of clubs or organizations: Not on file    Relationship status: Not on file  . Intimate partner violence:  Fear of current or ex partner: Not on file    Emotionally abused: Not on file    Physically abused: Not on file    Forced sexual activity: Not on file  Other Topics Concern  . Not on file  Social History Narrative  . Not on file      Review of Systems  All other systems reviewed and are negative.      Objective:   Physical Exam  Constitutional: She appears well-developed and well-nourished.  Neck: Normal range of motion. Neck supple. No tracheal deviation, no edema, no erythema and normal range of motion present. No thyromegaly present.    Cardiovascular: Normal rate, regular rhythm and normal heart sounds. Exam reveals no friction rub.  No murmur heard. Pulmonary/Chest: Effort normal and breath sounds normal. No stridor. No respiratory distress. She has no wheezes.  Lymphadenopathy:      She has no cervical adenopathy.  Vitals reviewed.         Assessment & Plan:  Clavicle pain - Plan: DG Clavicle Right  Chest wall pain - Plan: DG Chest 2 View, CBC with Differential/Platelet, COMPLETE METABOLIC PANEL WITH GFR  Hypothyroidism, unspecified type - Plan: CBC with Differential/Platelet, COMPLETE METABOLIC PANEL WITH GFR, TSH  I try to reassure the patient that I believe the area she is feeling when she examines her neck is the proximal portion of the clavicle.  I am uncertain why it is recently changing or why she recently noticed it.  It is slightly more prominent than the left proximal clavicle.  I am also uncertain why it is mildly tender to palpation.  Therefore I am going to start with dedicated x-rays of the clavicle as well as her chest to evaluate for any pathologic features.  However I try to reassure the patient that I feel this is most likely a benign finding.  While the patient is here, I will obtain her standard lab work including a CBC to evaluate for any leukocytosis as well as a TSH and a CMP.

## 2017-10-19 LAB — CBC WITH DIFFERENTIAL/PLATELET
BASOS ABS: 49 {cells}/uL (ref 0–200)
Basophils Relative: 0.6 %
Eosinophils Absolute: 156 cells/uL (ref 15–500)
Eosinophils Relative: 1.9 %
HEMATOCRIT: 40.1 % (ref 35.0–45.0)
HEMOGLOBIN: 13.5 g/dL (ref 11.7–15.5)
LYMPHS ABS: 2132 {cells}/uL (ref 850–3900)
MCH: 28.4 pg (ref 27.0–33.0)
MCHC: 33.7 g/dL (ref 32.0–36.0)
MCV: 84.2 fL (ref 80.0–100.0)
MPV: 11.2 fL (ref 7.5–12.5)
Monocytes Relative: 7.4 %
NEUTROS PCT: 64.1 %
Neutro Abs: 5256 cells/uL (ref 1500–7800)
Platelets: 296 10*3/uL (ref 140–400)
RBC: 4.76 10*6/uL (ref 3.80–5.10)
RDW: 13.5 % (ref 11.0–15.0)
Total Lymphocyte: 26 %
WBC: 8.2 10*3/uL (ref 3.8–10.8)
WBCMIX: 607 {cells}/uL (ref 200–950)

## 2017-10-19 LAB — TSH: TSH: 3.71 mIU/L

## 2017-10-19 LAB — COMPLETE METABOLIC PANEL WITH GFR
AG RATIO: 1.2 (calc) (ref 1.0–2.5)
ALBUMIN MSPROF: 4.2 g/dL (ref 3.6–5.1)
ALT: 19 U/L (ref 6–29)
AST: 23 U/L (ref 10–35)
Alkaline phosphatase (APISO): 71 U/L (ref 33–130)
BUN: 20 mg/dL (ref 7–25)
CALCIUM: 9.8 mg/dL (ref 8.6–10.4)
CO2: 26 mmol/L (ref 20–32)
Chloride: 104 mmol/L (ref 98–110)
Creat: 0.85 mg/dL (ref 0.50–1.05)
GFR, EST AFRICAN AMERICAN: 89 mL/min/{1.73_m2} (ref >=60)
GFR, Est Non African American: 77 mL/min/{1.73_m2} (ref >=60)
GLOBULIN: 3.5 g/dL (ref 1.9–3.7)
Glucose, Bld: 78 mg/dL (ref 65–99)
POTASSIUM: 4.1 mmol/L (ref 3.5–5.3)
SODIUM: 141 mmol/L (ref 135–146)
Total Bilirubin: 0.4 mg/dL (ref 0.2–1.2)
Total Protein: 7.7 g/dL (ref 6.1–8.1)

## 2017-10-20 ENCOUNTER — Encounter: Payer: Self-pay | Admitting: Family Medicine

## 2017-10-25 ENCOUNTER — Ambulatory Visit
Admission: RE | Admit: 2017-10-25 | Discharge: 2017-10-25 | Disposition: A | Discharge status: Home / Self Care | Payer: 59 | Source: Ambulatory Visit | Attending: Family Medicine | Admitting: Family Medicine

## 2017-10-25 DIAGNOSIS — R0789 Other chest pain: Secondary | ICD-10-CM

## 2017-10-25 DIAGNOSIS — M898X1 Other specified disorders of bone, shoulder: Secondary | ICD-10-CM

## 2017-10-28 ENCOUNTER — Other Ambulatory Visit: Payer: Self-pay | Admitting: Family Medicine

## 2018-03-22 ENCOUNTER — Other Ambulatory Visit: Payer: Self-pay | Admitting: Family Medicine

## 2018-04-20 ENCOUNTER — Other Ambulatory Visit: Payer: Self-pay

## 2018-04-20 ENCOUNTER — Other Ambulatory Visit: Payer: 59

## 2018-04-20 DIAGNOSIS — E039 Hypothyroidism, unspecified: Secondary | ICD-10-CM

## 2018-04-20 DIAGNOSIS — Z0000 Encounter for general adult medical examination without abnormal findings: Secondary | ICD-10-CM

## 2018-04-20 DIAGNOSIS — I10 Essential (primary) hypertension: Secondary | ICD-10-CM

## 2018-04-21 LAB — CBC WITH DIFFERENTIAL/PLATELET
ABSOLUTE MONOCYTES: 482 {cells}/uL (ref 200–950)
BASOS ABS: 43 {cells}/uL (ref 0–200)
Basophils Relative: 0.7 %
EOS ABS: 140 {cells}/uL (ref 15–500)
Eosinophils Relative: 2.3 %
HEMATOCRIT: 37.6 % (ref 35.0–45.0)
HEMOGLOBIN: 12.6 g/dL (ref 11.7–15.5)
LYMPHS ABS: 1861 {cells}/uL (ref 850–3900)
MCH: 28.6 pg (ref 27.0–33.0)
MCHC: 33.5 g/dL (ref 32.0–36.0)
MCV: 85.5 fL (ref 80.0–100.0)
MPV: 11 fL (ref 7.5–12.5)
Monocytes Relative: 7.9 %
NEUTROS ABS: 3575 {cells}/uL (ref 1500–7800)
NEUTROS PCT: 58.6 %
Platelets: 262 10*3/uL (ref 140–400)
RBC: 4.4 10*6/uL (ref 3.80–5.10)
RDW: 13 % (ref 11.0–15.0)
Total Lymphocyte: 30.5 %
WBC: 6.1 10*3/uL (ref 3.8–10.8)

## 2018-04-21 LAB — LIPID PANEL
Cholesterol: 169 mg/dL (ref <200)
HDL: 47 mg/dL — AB (ref >=50)
LDL Cholesterol (Calc): 101 mg/dL (calc) — ABNORMAL HIGH
Non-HDL Cholesterol (Calc): 122 mg/dL (calc) (ref <130)
TRIGLYCERIDES: 118 mg/dL (ref <150)
Total CHOL/HDL Ratio: 3.6 (calc) (ref <5.0)

## 2018-04-21 LAB — COMPREHENSIVE METABOLIC PANEL
AG RATIO: 1.4 (calc) (ref 1.0–2.5)
ALT: 18 U/L (ref 6–29)
AST: 22 U/L (ref 10–35)
Albumin: 4.2 g/dL (ref 3.6–5.1)
Alkaline phosphatase (APISO): 61 U/L (ref 37–153)
BILIRUBIN TOTAL: 0.5 mg/dL (ref 0.2–1.2)
BUN: 21 mg/dL (ref 7–25)
CALCIUM: 9.6 mg/dL (ref 8.6–10.4)
CO2: 26 mmol/L (ref 20–32)
Chloride: 106 mmol/L (ref 98–110)
Creat: 0.78 mg/dL (ref 0.50–1.05)
Globulin: 3.1 g/dL (calc) (ref 1.9–3.7)
Glucose, Bld: 78 mg/dL (ref 65–99)
Potassium: 3.8 mmol/L (ref 3.5–5.3)
SODIUM: 142 mmol/L (ref 135–146)
Total Protein: 7.3 g/dL (ref 6.1–8.1)

## 2018-04-21 LAB — TSH: TSH: 2.95 m[IU]/L (ref 0.40–4.50)

## 2018-04-22 ENCOUNTER — Telehealth: Payer: Self-pay | Admitting: Family Medicine

## 2018-04-22 ENCOUNTER — Encounter: Payer: Self-pay | Admitting: Family Medicine

## 2018-04-22 ENCOUNTER — Other Ambulatory Visit: Payer: Self-pay

## 2018-04-22 ENCOUNTER — Ambulatory Visit (INDEPENDENT_AMBULATORY_CARE_PROVIDER_SITE_OTHER): Payer: 59 | Admitting: Family Medicine

## 2018-04-22 VITALS — BP 130/92 | HR 86 | Temp 98.3°F | Resp 16 | Ht 65.0 in | Wt 261.0 lb

## 2018-04-22 DIAGNOSIS — I10 Essential (primary) hypertension: Secondary | ICD-10-CM

## 2018-04-22 DIAGNOSIS — Z0000 Encounter for general adult medical examination without abnormal findings: Secondary | ICD-10-CM | POA: Diagnosis not present

## 2018-04-22 DIAGNOSIS — E038 Other specified hypothyroidism: Secondary | ICD-10-CM | POA: Diagnosis not present

## 2018-04-22 DIAGNOSIS — Z1239 Encounter for other screening for malignant neoplasm of breast: Secondary | ICD-10-CM

## 2018-04-22 DIAGNOSIS — Z23 Encounter for immunization: Secondary | ICD-10-CM

## 2018-04-22 MED ORDER — HYDROCHLOROTHIAZIDE 25 MG PO TABS: 25.0000 mg | ORAL_TABLET | Freq: Every day | ORAL | 3 refills | Status: DC

## 2018-04-22 NOTE — Telephone Encounter (Signed)
Medication called/sent to requested pharmacy  

## 2018-04-22 NOTE — Addendum Note (Signed)
Addended by: Legrand Rams B on: 04/22/2018 12:41 PM   Modules accepted: Orders

## 2018-04-22 NOTE — Progress Notes (Signed)
Subjective:    Patient ID: Diana Hamilton, female    DOB: 1960/11/13, 58 y.o.   MRN: [id number]  HPI Here for cpe.  Patient had a colonoscopy in 2014.  She is not due again until 2024.  She is however overdue for mammogram.  She has a history of a partial hysterectomy and therefore does not require Pap smear.  She is due today for a flu shot.  Her tetanus shot is up-to-date.  Her blood pressure today is borderline at 130/92.  Her most recent lab work is listed below. Lab on 04/20/2018  Component Date Value Ref Range Status  . WBC 04/20/2018 6.1  3.8 - 10.8 Thousand/uL Final  . RBC 04/20/2018 4.40  3.80 - 5.10 Million/uL Final  . Hemoglobin 04/20/2018 12.6  11.7 - 15.5 g/dL Final  . HCT 11/01/3005 37.6  35.0 - 45.0 % Final  . MCV 04/20/2018 85.5  80.0 - 100.0 fL Final  . MCH 04/20/2018 28.6  27.0 - 33.0 pg Final  . MCHC 04/20/2018 33.5  32.0 - 36.0 g/dL Final  . RDW 62/26/3335 13.0  11.0 - 15.0 % Final  . Platelets 04/20/2018 262  140 - 400 Thousand/uL Final  . MPV 04/20/2018 11.0  7.5 - 12.5 fL Final  . Neutro Abs 04/20/2018 3,575  1,500 - 7,800 cells/uL Final  . Lymphs Abs 04/20/2018 1,861  850 - 3,900 cells/uL Final  . Absolute Monocytes 04/20/2018 482  200 - 950 cells/uL Final  . Eosinophils Absolute 04/20/2018 140  15 - 500 cells/uL Final  . Basophils Absolute 04/20/2018 43  0 - 200 cells/uL Final  . Neutrophils Relative % 04/20/2018 58.6  % Final  . Total Lymphocyte 04/20/2018 30.5  % Final  . Monocytes Relative 04/20/2018 7.9  % Final  . Eosinophils Relative 04/20/2018 2.3  % Final  . Basophils Relative 04/20/2018 0.7  % Final  . Glucose, Bld 04/20/2018 78  65 - 99 mg/dL Final   Comment: .            Fasting reference interval .   . BUN 04/20/2018 21  7 - 25 mg/dL Final  . Creat 45/62/5638 0.78  0.50 - 1.05 mg/dL Final   Comment: For patients >83 years of age, the reference limit for Creatinine is approximately 13% higher for people identified as African-American. .   Edwena Felty Ratio 04/20/2018 NOT APPLICABLE  6 - 22 (calc) Final  . Sodium 04/20/2018 142  135 - 146 mmol/L Final  . Potassium 04/20/2018 3.8  3.5 - 5.3 mmol/L Final  . Chloride 04/20/2018 106  98 - 110 mmol/L Final  . CO2 04/20/2018 26  20 - 32 mmol/L Final  . Calcium 04/20/2018 9.6  8.6 - 10.4 mg/dL Final  . Total Protein 04/20/2018 7.3  6.1 - 8.1 g/dL Final  . Albumin 93/73/4287 4.2  3.6 - 5.1 g/dL Final  . Globulin 68/12/5724 3.1  1.9 - 3.7 g/dL (calc) Final  . AG Ratio 04/20/2018 1.4  1.0 - 2.5 (calc) Final  . Total Bilirubin 04/20/2018 0.5  0.2 - 1.2 mg/dL Final  . Alkaline phosphatase (APISO) 04/20/2018 61  37 - 153 U/L Final  . AST 04/20/2018 22  10 - 35 U/L Final  . ALT 04/20/2018 18  6 - 29 U/L Final  . Cholesterol 04/20/2018 169  <200 mg/dL Final  . HDL 20/35/5974 47* > OR = 50 mg/dL Final  . Triglycerides 04/20/2018 118  <150 mg/dL Final  . LDL Cholesterol (Calc)  04/20/2018 101* mg/dL (calc) Final   Comment: Reference range: <100 . Desirable range <100 mg/dL for primary prevention;   <70 mg/dL for patients with CHD or diabetic patients  with > or = 2 CHD risk factors. Marland Kitchen LDL-C is now calculated using the Martin-Hopkins  calculation, which is a validated novel method providing  better accuracy than the Friedewald equation in the  estimation of LDL-C.  Horald Pollen et al. [place] Ahr. 9833;825(05): 2061-2068  (http://education.[facility].com/faq/FAQ164)   . Total CHOL/HDL Ratio 04/20/2018 3.6  <3.9 (calc) Final  . Non-HDL Cholesterol (Calc) 04/20/2018 122  <130 mg/dL (calc) Final   Comment: For patients with diabetes plus 1 major ASCVD risk  factor, treating to a non-HDL-C goal of <100 mg/dL  (LDL-C of <76 mg/dL) is considered a therapeutic  option.   . TSH 04/20/2018 2.95  0.40 - 4.50 mIU/L Final    Immunization History  Administered Date(s) Administered  . Hepatitis A 07/03/2005  . Influenza,inj,Quad PF,6+ Mos 04/11/2015, 04/09/2016  . Tdap 08/08/2010,  04/13/2016  . Typhoid Inactivated 07/03/2005    Past Medical History:  Diagnosis Date  . Chronic venous insufficiency   . DDD (degenerative disc disease)   . Hyperlipidemia   . Hypertension   . Peripheral neuropathy   . Positive ANA (antinuclear antibody)   . Thyroid disease    Past Surgical History:  Procedure Laterality Date  . ABDOMINAL HYSTERECTOMY  01/2006   fibroids   Current Outpatient Medications on File Prior to Visit  Medication Sig Dispense Refill  . levothyroxine (SYNTHROID, LEVOTHROID) 25 MCG tablet TAKE 1 TABLET DAILY BEFORE BREAKFAST 90 tablet 4  . losartan (COZAAR) 50 MG tablet TAKE 1 TABLET DAILY 90 tablet 4  . medium chain triglycerides (MCT OIL) oil Take by mouth 3 (three) times daily.    . meloxicam (MOBIC) 15 MG tablet TAKE 1 TABLET(15 MG) BY MOUTH DAILY 90 tablet 3  . hydrochlorothiazide (HYDRODIURIL) 25 MG tablet Take 1 tablet (25 mg total) by mouth daily. (Patient not taking: Reported on 04/22/2018) 90 tablet 3   No current facility-administered medications on file prior to visit.    Allergies  Allergen Reactions  . Lisinopril Cough  . Sulfa Antibiotics    Social History   Socioeconomic History  . Marital status: Married    Spouse name: Not on file  . Number of children: 2  . Years of education: Not on file  . Highest education level: Not on file  Occupational History  . Occupation: Financial risk analyst  . Financial resource strain: Not on file  . Food insecurity:    Worry: Not on file    Inability: Not on file  . Transportation needs:    Medical: Not on file    Non-medical: Not on file  Tobacco Use  . Smoking status: Never Smoker  . Smokeless tobacco: Never Used  Substance and Sexual Activity  . Alcohol use: No  . Drug use: No  . Sexual activity: Not on file  Lifestyle  . Physical activity:    Days per week: Not on file    Minutes per session: Not on file  . Stress: Not on file  Relationships  . Social connections:    Talks on  phone: Not on file    Gets together: Not on file    Attends religious service: Not on file    Active member of club or organization: Not on file    Attends meetings of clubs or organizations: Not on file  Relationship status: Not on file  . Intimate partner violence:    Fear of current or ex partner: Not on file    Emotionally abused: Not on file    Physically abused: Not on file    Forced sexual activity: Not on file  Other Topics Concern  . Not on file  Social History Narrative  . Not on file   Family History  Problem Relation Age of Onset  . Heart disease Mother        arrhythmia  . Cancer Mother [id number]       breast  . Hypertension Father   . Stroke Father   . Cancer Sister [phone]       breast     Review of Systems  All other systems reviewed and are negative.      Objective:   Physical Exam  Constitutional: She is oriented to person, place, and time. She appears well-developed and well-nourished. No distress.  HENT:  Head: Normocephalic and atraumatic.  Right Ear: External ear normal.  Left Ear: External ear normal.  Nose: Nose normal.  Mouth/Throat: Oropharynx is clear and moist. No oropharyngeal exudate.  Eyes: Pupils are equal, round, and reactive to light. Conjunctivae and EOM are normal. Right eye exhibits no discharge. Left eye exhibits no discharge. No scleral icterus.  Neck: Normal range of motion. Neck supple. No JVD present. No tracheal deviation present. No thyromegaly present.  Cardiovascular: Normal rate, regular rhythm, normal heart sounds and intact distal pulses. Exam reveals no friction rub.  No murmur heard. Pulmonary/Chest: Effort normal and breath sounds normal. No stridor. No respiratory distress. She has no wheezes. She has no rales. She exhibits no tenderness.  Abdominal: Soft. Bowel sounds are normal. She exhibits no distension and no mass. There is no abdominal tenderness. There is no rebound and no guarding.  Musculoskeletal: Normal range of  motion.        General: No tenderness or edema.  Lymphadenopathy:    She has no cervical adenopathy.  Neurological: She is alert and oriented to person, place, and time. She has normal reflexes. No cranial nerve deficit. She exhibits normal muscle tone. Coordination normal.  Skin: Skin is warm. No rash noted. She is not diaphoretic. No erythema. No pallor.  Psychiatric: She has a normal mood and affect. Her behavior is normal. Judgment and thought content normal.  Vitals reviewed.         Assessment & Plan:  General medical exam, hypothyroidism, essential hypertension  Physical exam is significant for obesity. Continue to encourage 30 minutes a day 5 days a week of aerobic exercise along with a 1500-calorie a day diet.  We did discuss possibly starting Saxenda to help with weight loss.  She will check with her insurance to see if they cover weight loss medication.  Thyroid medication is appropriately dosed.  Cholesterol is excellent.  Blood pressure today is borderline.  I encouraged the patient to check her blood pressure frequently and notify me of the values.  If consistently greater than 140/90, I would add additional medication to control her blood pressure.

## 2018-04-22 NOTE — Telephone Encounter (Signed)
Pt needs refill on HCTZ [facility] [address]

## 2018-05-06 ENCOUNTER — Other Ambulatory Visit: Payer: Self-pay | Admitting: Family Medicine

## 2018-10-05 ENCOUNTER — Other Ambulatory Visit: Payer: Self-pay

## 2018-10-05 ENCOUNTER — Ambulatory Visit
Admission: RE | Admit: 2018-10-05 | Discharge: 2018-10-05 | Disposition: A | Discharge status: Home / Self Care | Payer: 59 | Source: Ambulatory Visit | Attending: Family Medicine | Admitting: Family Medicine

## 2018-10-05 DIAGNOSIS — Z1239 Encounter for other screening for malignant neoplasm of breast: Secondary | ICD-10-CM

## 2019-01-21 ENCOUNTER — Other Ambulatory Visit: Payer: Self-pay | Admitting: Family Medicine

## 2019-06-15 ENCOUNTER — Other Ambulatory Visit: Payer: Self-pay | Admitting: Family Medicine

## 2019-09-14 ENCOUNTER — Encounter: Payer: 59 | Admitting: Family Medicine

## 2019-11-07 ENCOUNTER — Other Ambulatory Visit: Payer: Self-pay | Admitting: Family Medicine

## 2019-11-20 ENCOUNTER — Other Ambulatory Visit: Payer: Self-pay | Admitting: Family Medicine

## 2019-11-20 ENCOUNTER — Other Ambulatory Visit: Payer: Self-pay

## 2019-11-20 ENCOUNTER — Ambulatory Visit
Admission: RE | Admit: 2019-11-20 | Discharge: 2019-11-20 | Disposition: A | Discharge status: Home / Self Care | Payer: 59 | Source: Ambulatory Visit | Attending: Family Medicine | Admitting: Family Medicine

## 2019-11-20 DIAGNOSIS — Z1231 Encounter for screening mammogram for malignant neoplasm of breast: Secondary | ICD-10-CM

## 2019-12-12 ENCOUNTER — Other Ambulatory Visit: Payer: Self-pay | Admitting: Family Medicine

## 2019-12-28 ENCOUNTER — Other Ambulatory Visit: Payer: Self-pay

## 2019-12-28 MED ORDER — LOSARTAN POTASSIUM 50 MG PO TABS: 50.0000 mg | ORAL_TABLET | Freq: Every day | ORAL | 0 refills | Status: DC

## 2020-01-08 ENCOUNTER — Ambulatory Visit (INDEPENDENT_AMBULATORY_CARE_PROVIDER_SITE_OTHER): Payer: 59 | Admitting: Family Medicine

## 2020-01-08 ENCOUNTER — Other Ambulatory Visit: Payer: Self-pay

## 2020-01-08 VITALS — BP 160/90 | HR 93 | Temp 98.6°F | Ht 64.0 in | Wt 259.0 lb

## 2020-01-08 DIAGNOSIS — M25561 Pain in right knee: Secondary | ICD-10-CM | POA: Diagnosis not present

## 2020-01-08 DIAGNOSIS — G8929 Other chronic pain: Secondary | ICD-10-CM

## 2020-01-08 DIAGNOSIS — M2550 Pain in unspecified joint: Secondary | ICD-10-CM | POA: Diagnosis not present

## 2020-01-08 DIAGNOSIS — I10 Essential (primary) hypertension: Secondary | ICD-10-CM

## 2020-01-08 MED ORDER — DICLOFENAC SODIUM 75 MG PO TBEC: 75.0000 mg | DELAYED_RELEASE_TABLET | Freq: Two times a day (BID) | ORAL | 1 refills | Status: DC

## 2020-01-08 MED ORDER — LOSARTAN POTASSIUM 100 MG PO TABS: 100.0000 mg | ORAL_TABLET | Freq: Every day | ORAL | 3 refills | Status: DC

## 2020-01-08 NOTE — Progress Notes (Signed)
Subjective:    Patient ID: Diana Hamilton, female    DOB: Feb 20, 1960, 59 y.o.   MRN: [id number]  HPI Patient is here today with an elevated blood pressure of 160/90.  She has been taking losartan 50 mg a day.  She denies any chest pain or dyspnea on exertion.  However she has been taking meloxicam 30 mg a day due to knee pain.  She states both of her knees hurt terribly bad.  She has difficult time walking up steps due to pain in both knees particular her right knee.  She states that she has a history of "rheumatoid arthritis".  At this time however the only joint involved would be both knees.  She denies any pain in her hands or in her fingers or in her wrist or in her elbows.  She is wanting something stronger than meloxicam to help with the pain.  Her BMI is 44 today. Past Medical History:  Diagnosis Date  . Chronic venous insufficiency   . DDD (degenerative disc disease)   . Hyperlipidemia   . Hypertension   . Peripheral neuropathy   . Positive ANA (antinuclear antibody)   . Thyroid disease    Past Surgical History:  Procedure Laterality Date  . ABDOMINAL HYSTERECTOMY  01/2006   fibroids   Current Outpatient Medications on File Prior to Visit  Medication Sig Dispense Refill  . hydrochlorothiazide (HYDRODIURIL) 25 MG tablet Take 1 tablet (25 mg total) by mouth daily. 90 tablet 3  . levothyroxine (SYNTHROID) 25 MCG tablet TAKE 1 TABLET DAILY BEFORE BREAKFAST 30 tablet 0  . losartan (COZAAR) 50 MG tablet Take 1 tablet (50 mg total) by mouth daily. 30 tablet 0  . meloxicam (MOBIC) 15 MG tablet TAKE 1 TABLET(15 MG) BY MOUTH DAILY 90 tablet 3  . medium chain triglycerides (MCT OIL) oil Take by mouth 3 (three) times daily. (Patient not taking: Reported on 01/08/2020)     No current facility-administered medications on file prior to visit.   Allergies  Allergen Reactions  . Lisinopril Cough  . Sulfa Antibiotics    Social History   Socioeconomic History  . Marital status: Married      Spouse name: Not on file  . Number of children: 2  . Years of education: Not on file  . Highest education level: Not on file  Occupational History  . Occupation: textile  Tobacco Use  . Smoking status: Never Smoker  . Smokeless tobacco: Never Used  Substance and Sexual Activity  . Alcohol use: No  . Drug use: No  . Sexual activity: Not on file  Other Topics Concern  . Not on file  Social History Narrative  . Not on file   Social Determinants of Health   Financial Resource Strain:   . Difficulty of Paying Living Expenses: Not on file  Food Insecurity:   . Worried About Programme researcher, broadcasting/film/video in the Last Year: Not on file  . Ran Out of Food in the Last Year: Not on file  Transportation Needs:   . Lack of Transportation (Medical): Not on file  . Lack of Transportation (Non-Medical): Not on file  Physical Activity:   . Days of Exercise per Week: Not on file  . Minutes of Exercise per Session: Not on file  Stress:   . Feeling of Stress : Not on file  Social Connections:   . Frequency of Communication with Friends and Family: Not on file  . Frequency of Social Gatherings with  Friends and Family: Not on file  . Attends Religious Services: Not on file  . Active Member of Clubs or Organizations: Not on file  . Attends Banker Meetings: Not on file  . Marital Status: Not on file  Intimate Partner Violence:   . Fear of Current or Ex-Partner: Not on file  . Emotionally Abused: Not on file  . Physically Abused: Not on file  . Sexually Abused: Not on file   Family History  Problem Relation Age of Onset  . Heart disease Mother        arrhythmia  . Cancer Mother 33       breast  . Breast cancer Mother   . Hypertension Father   . Stroke Father   . Cancer Sister 28       breast  . Breast cancer Maternal Aunt   . Breast cancer Paternal Aunt   . Breast cancer Paternal Aunt   . Breast cancer Paternal Aunt      Review of Systems  All other systems reviewed and  are negative.      Objective:   Physical Exam Vitals reviewed.  Constitutional:      General: She is not in acute distress.    Appearance: She is well-developed. She is not diaphoretic.  HENT:     Head: Normocephalic and atraumatic.     Right Ear: External ear normal.     Left Ear: External ear normal.     Nose: Nose normal.     Mouth/Throat:     Pharynx: No oropharyngeal exudate.  Eyes:     General: No scleral icterus.       Right eye: No discharge.        Left eye: No discharge.     Conjunctiva/sclera: Conjunctivae normal.     Pupils: Pupils are equal, round, and reactive to light.  Neck:     Thyroid: No thyromegaly.     Vascular: No JVD.     Trachea: No tracheal deviation.  Cardiovascular:     Rate and Rhythm: Normal rate and regular rhythm.     Heart sounds: Normal heart sounds. No murmur heard.  No friction rub.  Pulmonary:     Effort: Pulmonary effort is normal. No respiratory distress.     Breath sounds: Normal breath sounds. No stridor. No wheezing or rales.  Chest:     Chest wall: No tenderness.  Abdominal:     General: Bowel sounds are normal. There is no distension.     Palpations: Abdomen is soft. There is no mass.     Tenderness: There is no abdominal tenderness. There is no guarding or rebound.  Musculoskeletal:        General: No tenderness.     Cervical back: Normal range of motion and neck supple.     Right knee: Bony tenderness present. Decreased range of motion. Normal meniscus.     Left knee: Bony tenderness present. Decreased range of motion. Normal meniscus.  Lymphadenopathy:     Cervical: No cervical adenopathy.  Skin:    General: Skin is warm.     Coloration: Skin is not pale.     Findings: No erythema or rash.  Neurological:     Mental Status: She is alert and oriented to person, place, and time.     Cranial Nerves: No cranial nerve deficit.     Motor: No abnormal muscle tone.     Coordination: Coordination normal.     Deep Tendon  Reflexes: Reflexes are normal and symmetric.  Psychiatric:        Behavior: Behavior normal.        Thought Content: Thought content normal.        Judgment: Judgment normal.           Assessment & Plan:  Chronic pain of right knee - Plan: DG Knee Complete 4 Views Right  Polyarthralgia - Plan: CBC with Differential/Platelet, BASIC METABOLIC PANEL WITH GFR, Sedimentation rate, Rheumatoid factor  Essential hypertension  Blood pressure is extremely high.  Increase losartan to 100 mg a day and recheck blood pressure in 1 month.  At that point I would add additional medication until her blood sugar is less than 140/90.  I believe that losing weight would be her best option to help her knee pain.  Therefore I recommended Saxenda.  I encouraged the patient to check with her insurance to see if weight loss medication is covered and if so, I will be glad to call the Saxenda out.  I do not believe that her knee pain is due to autoimmune arthritis.  None of the other joints particular in her upper extremities are involved.  I will check a sedimentation rate and a rheumatoid factor based on her history however I suspect osteoarthritis.  Therefore I would like to start by obtaining an x-ray of the right knee does determine the severity.  Discontinue meloxicam and only use diclofenac 75 mg twice daily.  If the x-ray confirms severe osteoarthritis, I would recommend orthopedics consultation as she has already tried cortisone injections with no benefit.  The best option would be weight loss at that point and hopefully the Saxenda would help achieve this.

## 2020-01-09 ENCOUNTER — Ambulatory Visit
Admission: RE | Admit: 2020-01-09 | Discharge: 2020-01-09 | Disposition: A | Discharge status: Home / Self Care | Payer: 59 | Source: Ambulatory Visit | Attending: Family Medicine | Admitting: Family Medicine

## 2020-01-09 ENCOUNTER — Other Ambulatory Visit: Payer: Self-pay | Admitting: Family Medicine

## 2020-01-09 DIAGNOSIS — G8929 Other chronic pain: Secondary | ICD-10-CM

## 2020-01-09 LAB — CBC WITH DIFFERENTIAL/PLATELET
Absolute Monocytes: 706 cells/uL (ref 200–950)
Basophils Absolute: 60 cells/uL (ref 0–200)
Basophils Relative: 0.7 %
Eosinophils Absolute: 119 cells/uL (ref 15–500)
Eosinophils Relative: 1.4 %
HCT: 42.1 % (ref 35.0–45.0)
Hemoglobin: 13.8 g/dL (ref 11.7–15.5)
Lymphs Abs: 2032 cells/uL (ref 850–3900)
MCH: 28.8 pg (ref 27.0–33.0)
MCHC: 32.8 g/dL (ref 32.0–36.0)
MCV: 87.7 fL (ref 80.0–100.0)
MPV: 11.5 fL (ref 7.5–12.5)
Monocytes Relative: 8.3 %
Neutro Abs: 5585 cells/uL (ref 1500–7800)
Neutrophils Relative %: 65.7 %
Platelets: 251 10*3/uL (ref 140–400)
RBC: 4.8 10*6/uL (ref 3.80–5.10)
RDW: 13.1 % (ref 11.0–15.0)
Total Lymphocyte: 23.9 %
WBC: 8.5 10*3/uL (ref 3.8–10.8)

## 2020-01-09 LAB — BASIC METABOLIC PANEL WITH GFR
BUN: 17 mg/dL (ref 7–25)
CO2: 27 mmol/L (ref 20–32)
Calcium: 10 mg/dL (ref 8.6–10.4)
Chloride: 104 mmol/L (ref 98–110)
Creat: 0.77 mg/dL (ref 0.50–1.05)
GFR, Est African American: 99 mL/min/{1.73_m2} (ref >=60)
GFR, Est Non African American: 86 mL/min/{1.73_m2} (ref >=60)
Glucose, Bld: 84 mg/dL (ref 65–99)
Potassium: 4 mmol/L (ref 3.5–5.3)
Sodium: 141 mmol/L (ref 135–146)

## 2020-01-09 LAB — RHEUMATOID FACTOR: Rheumatoid fact SerPl-aCnc: <14 IU/mL (ref <14)

## 2020-01-09 LAB — SEDIMENTATION RATE: Sed Rate: 29 mm/h (ref 0–30)

## 2020-02-06 ENCOUNTER — Other Ambulatory Visit: Payer: Self-pay | Admitting: Family Medicine

## 2020-03-12 MED ORDER — LOSARTAN POTASSIUM 100 MG PO TABS: 100.0000 mg | ORAL_TABLET | Freq: Every day | ORAL | 2 refills | Status: DC

## 2020-03-12 MED ORDER — HYDROCHLOROTHIAZIDE 25 MG PO TABS: 25.0000 mg | ORAL_TABLET | Freq: Every day | ORAL | 2 refills | Status: DC

## 2020-03-12 MED ORDER — LEVOTHYROXINE SODIUM 25 MCG PO TABS: ORAL_TABLET | ORAL | 2 refills | Status: DC

## 2020-03-12 NOTE — Telephone Encounter (Signed)
Pt called to ger her medications sent to [facility] in [address]. I have sent the 3 medication she is requesting over there. Pt stated understanding.

## 2020-04-09 ENCOUNTER — Other Ambulatory Visit: Payer: Self-pay | Admitting: *Deleted

## 2020-04-09 MED ORDER — LOSARTAN POTASSIUM 100 MG PO TABS: 100.0000 mg | ORAL_TABLET | Freq: Every day | ORAL | 2 refills | Status: DC

## 2020-04-09 MED ORDER — DICLOFENAC SODIUM 75 MG PO TBEC: 75.0000 mg | DELAYED_RELEASE_TABLET | Freq: Two times a day (BID) | ORAL | 1 refills | Status: DC

## 2020-04-09 MED ORDER — HYDROCHLOROTHIAZIDE 25 MG PO TABS: 25.0000 mg | ORAL_TABLET | Freq: Every day | ORAL | 2 refills | Status: DC

## 2020-05-28 ENCOUNTER — Other Ambulatory Visit: Payer: Self-pay

## 2020-05-29 ENCOUNTER — Telehealth: Payer: Self-pay | Admitting: Family Medicine

## 2020-05-29 NOTE — Telephone Encounter (Signed)
[facility] requesting a 90 day supply on Meloxicam tabs 15 mg.

## 2020-07-25 ENCOUNTER — Other Ambulatory Visit: Payer: Self-pay

## 2020-07-25 ENCOUNTER — Emergency Department (HOSPITAL_COMMUNITY)
Admission: EM | Admit: 2020-07-25 | Discharge: 2020-07-26 | Disposition: A | Discharge status: Home / Self Care | Payer: 59 | Attending: Emergency Medicine | Admitting: Emergency Medicine

## 2020-07-25 ENCOUNTER — Emergency Department (HOSPITAL_COMMUNITY): Payer: 59

## 2020-07-25 DIAGNOSIS — E039 Hypothyroidism, unspecified: Secondary | ICD-10-CM | POA: Insufficient documentation

## 2020-07-25 DIAGNOSIS — R0789 Other chest pain: Secondary | ICD-10-CM | POA: Insufficient documentation

## 2020-07-25 DIAGNOSIS — M549 Dorsalgia, unspecified: Secondary | ICD-10-CM | POA: Insufficient documentation

## 2020-07-25 DIAGNOSIS — R079 Chest pain, unspecified: Secondary | ICD-10-CM

## 2020-07-25 DIAGNOSIS — Z79899 Other long term (current) drug therapy: Secondary | ICD-10-CM | POA: Diagnosis not present

## 2020-07-25 DIAGNOSIS — R5383 Other fatigue: Secondary | ICD-10-CM | POA: Insufficient documentation

## 2020-07-25 DIAGNOSIS — R0602 Shortness of breath: Secondary | ICD-10-CM | POA: Insufficient documentation

## 2020-07-25 DIAGNOSIS — R030 Elevated blood-pressure reading, without diagnosis of hypertension: Secondary | ICD-10-CM

## 2020-07-25 DIAGNOSIS — Z8616 Personal history of COVID-19: Secondary | ICD-10-CM | POA: Insufficient documentation

## 2020-07-25 DIAGNOSIS — I10 Essential (primary) hypertension: Secondary | ICD-10-CM | POA: Insufficient documentation

## 2020-07-25 LAB — BASIC METABOLIC PANEL
Anion gap: 9 (ref 5–15)
BUN: 20 mg/dL (ref 6–20)
CO2: 25 mmol/L (ref 22–32)
Calcium: 9.5 mg/dL (ref 8.9–10.3)
Chloride: 105 mmol/L (ref 98–111)
Creatinine, Ser: 0.75 mg/dL (ref 0.44–1.00)
GFR, Estimated: >60 mL/min (ref >60)
Glucose, Bld: 92 mg/dL (ref 70–99)
Potassium: 3.8 mmol/L (ref 3.5–5.1)
Sodium: 139 mmol/L (ref 135–145)

## 2020-07-25 LAB — CBC
HCT: 42.6 % (ref 36.0–46.0)
Hemoglobin: 13.5 g/dL (ref 12.0–15.0)
MCH: 28.7 pg (ref 26.0–34.0)
MCHC: 31.7 g/dL (ref 30.0–36.0)
MCV: 90.6 fL (ref 80.0–100.0)
Platelets: 309 10*3/uL (ref 150–400)
RBC: 4.7 MIL/uL (ref 3.87–5.11)
RDW: 13.7 % (ref 11.5–15.5)
WBC: 9.2 10*3/uL (ref 4.0–10.5)
nRBC: 0 % (ref 0.0–0.2)

## 2020-07-25 LAB — TROPONIN I (HIGH SENSITIVITY)
Troponin I (High Sensitivity): 5 ng/L (ref <18)
Troponin I (High Sensitivity): 7 ng/L (ref <18)

## 2020-07-25 NOTE — ED Provider Notes (Signed)
Emergency Medicine Provider Triage Evaluation Note  Diana Hamilton , a 60 y.o. female  was evaluated in triage.  Pt complains of chest pain for the last 2 days.  Review of Systems  Positive: Chest pain Negative: Shortness of breath, cough, vomiting, diarrhea, abdominal pain  Physical Exam  BP (!) 179/98 (BP Location: Right Arm)   Pulse 100   Temp 98.8 F (37.1 C)   Resp 18   Ht 5\' 6"  (1.676 m)   Wt 119.3 kg   SpO2 99%   BMI 42.45 kg/m  Gen:   Awake, no distress   Resp:  Normal effort  MSK:   Moves extremities without difficulty  Other:    Medical Decision Making  Medically screening exam initiated at 4:47 PM.  Appropriate orders placed.  Diana Hamilton was informed that the remainder of the evaluation will be completed by another provider, this initial triage assessment does not replace that evaluation, and the importance of remaining in the ED until their evaluation is complete.     Narda Rutherford, PA-C 07/25/20 1804    07/27/20, DO 07/25/20 2220

## 2020-07-25 NOTE — ED Triage Notes (Signed)
Pt c/o chest "tightness" x24hrs, states no CV/Resp hx, maternal hx cardiomegaly. Pt states pain goes straight through to back, pain at rest, w inspiration. Treating URI w Augmentin, has had Covid in May, vaccinated against same.

## 2020-07-26 LAB — TROPONIN I (HIGH SENSITIVITY)
Troponin I (High Sensitivity): 7 ng/L (ref <18)
Troponin I (High Sensitivity): 7 ng/L (ref <18)

## 2020-07-26 LAB — HEPATIC FUNCTION PANEL
ALT: 33 U/L (ref 0–44)
AST: 25 U/L (ref 15–41)
Albumin: 3.7 g/dL (ref 3.5–5.0)
Alkaline Phosphatase: 69 U/L (ref 38–126)
Bilirubin, Direct: 0.1 mg/dL (ref 0.0–0.2)
Indirect Bilirubin: 1 mg/dL — ABNORMAL HIGH (ref 0.3–0.9)
Total Bilirubin: 1.1 mg/dL (ref 0.3–1.2)
Total Protein: 8 g/dL (ref 6.5–8.1)

## 2020-07-26 LAB — BRAIN NATRIURETIC PEPTIDE: B Natriuretic Peptide: 20.7 pg/mL (ref 0.0–100.0)

## 2020-07-26 LAB — D-DIMER, QUANTITATIVE: D-Dimer, Quant: 0.53 ug{FEU}/mL — ABNORMAL HIGH (ref 0.00–0.50)

## 2020-07-26 LAB — LIPASE, BLOOD: Lipase: 26 U/L (ref 11–51)

## 2020-07-26 MED ORDER — KETOROLAC TROMETHAMINE 30 MG/ML IJ SOLN
30.0000 mg | Freq: Once | INTRAMUSCULAR | Status: AC
  Administered 2020-07-26: 30 mg via INTRAMUSCULAR
  Filled 2020-07-26: qty 1

## 2020-07-26 NOTE — Discharge Instructions (Signed)
As we discussed you are low risk for heart disease but not 0 risk.  Her blood pressure today is elevated and she continue blood pressure medication and follow-up with your doctor regarding this.  You should follow-up with a cardiologist to have a stress test.  Return to the ED for chest pain becomes exertional, associated shortness of breath, nausea, vomiting, sweating, other concerns.

## 2020-07-26 NOTE — ED Provider Notes (Signed)
Pt here for evaluation of chest pain. Labs pending  troponins are negative times two. She did have improvement in her pain after Toradol administration. Presentation is not consistent with PE, dissection. Discussed with patient home care for musculoskeletal chest pain with outpatient follow-up and return precautions.   Tilden Fossa, MD 07/26/20 [phone]

## 2020-07-26 NOTE — ED Notes (Signed)
Pt complaining of headache and increased chest pain after movement. Triage RN and PA made aware. Vital signs updated.

## 2020-07-26 NOTE — ED Notes (Signed)
B/P in right arm - 145/95 B/P in left arm - 162/109 Dr. Madilyn Hook aware of results

## 2020-07-26 NOTE — ED Notes (Signed)
Pt in restroom at this time. Ambulated from restroom to her room with steady gait

## 2020-07-26 NOTE — ED Notes (Signed)
Provider at bedside

## 2020-07-26 NOTE — ED Notes (Signed)
Pt ambulated to restroom. 

## 2020-07-26 NOTE — ED Notes (Signed)
ED Provider at bedside, Dr Rees 

## 2020-07-26 NOTE — ED Provider Notes (Signed)
Diana Hamilton   CSN: [id number] Arrival date & time: 07/25/20  1521     History Chief Complaint  Patient presents with   Chest Pain    Diana Hamilton is a 60 y.o. female.   Patient with a history of hypertension, hypothyroidism, recent COVID in May 28 June second presenting with central chest pain and pressure that has been constant since the evening of June 15.  She describes pain and pressure in the center of her chest that goes straight through to her back.  Pain is not exertional or pleuritic.  No associated cough or fever.  States she been short of breath for years since her last stress test in 2013.  This is unchanged from her baseline.  He denies any nausea, vomiting, fever.  No leg pain or leg swelling. Chest pain is slightly worse with palpation and better with rest.  It waxes and wanes in severity but never goes away completely.  No associated nausea or vomiting.  No diaphoresis.  Last stress test was in 2013.  Had an echocardiogram in 2018 that showed diastolic dysfunction with normal ejection fraction. States her blood pressure is always high and she is always short of breath. States she is currently taking Augmentin for "sinus infection" because of nasal drainage and cough.  She has been on Augmentin for the past 2 days and believes the pain started after that.  She also moved a large roll of fabric at work and feels like she might of pulled something in her chest.  Her pain is worse when she breathes in and worse in her chest is pushed on  The history is provided by the patient and a relative.  Chest Pain Associated symptoms: back pain, fatigue and shortness of breath   Associated symptoms: no abdominal pain, no cough, no dizziness, no fever, no headache, no nausea, no vomiting and no weakness       Past Medical History:  Diagnosis Date   Chronic venous insufficiency    DDD (degenerative disc disease)    Hyperlipidemia     Hypertension    Peripheral neuropathy    Positive ANA (antinuclear antibody)    Thyroid disease     Patient Active Problem List   Diagnosis Date Noted   Hypothyroidism 12/25/2014   Positive ANA (antinuclear antibody)    DDD (degenerative disc disease)    Hypertension    Chronic venous insufficiency     Past Surgical History:  Procedure Laterality Date   ABDOMINAL HYSTERECTOMY  01/2006   fibroids     OB History   No obstetric history on file.     Family History  Problem Relation Age of Onset   Heart disease Mother        arrhythmia   Cancer Mother 19       breast   Breast cancer Mother    Hypertension Father    Stroke Father    Cancer Sister 5       breast   Breast cancer Maternal Aunt    Breast cancer Paternal Aunt    Breast cancer Paternal Aunt    Breast cancer Paternal Aunt     Social History   Tobacco Use   Smoking status: Never   Smokeless tobacco: Never  Substance Use Topics   Alcohol use: No   Drug use: No    Home Medications Prior to Admission medications   Medication Sig Start Date End Date Taking? Authorizing Provider  diclofenac (VOLTAREN) 75 MG EC tablet Take 1 tablet (75 mg total) by mouth 2 (two) times daily. 04/09/20   Donita Brooks, MD  hydrochlorothiazide (HYDRODIURIL) 25 MG tablet Take 1 tablet (25 mg total) by mouth daily. 04/09/20   Donita Brooks, MD  levothyroxine (SYNTHROID) 25 MCG tablet TAKE 1 TABLET DAILY BEFORE BREAKFAST 03/12/20   Donita Brooks, MD  losartan (COZAAR) 100 MG tablet Take 1 tablet (100 mg total) by mouth daily. 04/09/20   Donita Brooks, MD  medium chain triglycerides (MCT OIL) oil Take by mouth 3 (three) times daily. Patient not taking: Reported on 01/08/2020    [provider]    Allergies    Lisinopril and Sulfa antibiotics  Review of Systems   Review of Systems  Constitutional:  Positive for fatigue. Negative for activity change, appetite change and fever.  HENT:  Negative for  congestion and rhinorrhea.   Respiratory:  Positive for chest tightness and shortness of breath. Negative for cough.   Cardiovascular:  Positive for chest pain.  Gastrointestinal:  Negative for abdominal pain, nausea and vomiting.  Genitourinary:  Negative for dysuria and hematuria.  Musculoskeletal:  Positive for back pain. Negative for arthralgias and myalgias.  Neurological:  Negative for dizziness, weakness and headaches.   all other systems are negative except as noted in the HPI and PMH.   Physical Exam Updated Vital Signs BP (!) 194/99 (BP Location: Right Arm)   Pulse 82   Temp 99.4 F (37.4 C)   Resp 17   Ht 5\' 6"  (1.676 m)   Wt 119.3 kg   SpO2 98%   BMI 42.45 kg/m   Physical Exam Vitals and nursing Hamilton reviewed.  Constitutional:      General: She is not in acute distress.    Appearance: She is well-developed.  HENT:     Head: Normocephalic and atraumatic.     Mouth/Throat:     Pharynx: No oropharyngeal exudate.  Eyes:     Conjunctiva/sclera: Conjunctivae normal.     Pupils: Pupils are equal, round, and reactive to light.  Neck:     Comments: No meningismus. Cardiovascular:     Rate and Rhythm: Normal rate and regular rhythm.     Heart sounds: Normal heart sounds. No murmur heard. Pulmonary:     Effort: Pulmonary effort is normal. No respiratory distress.     Breath sounds: Normal breath sounds.     Comments: Central chest wall tenderness worse with palpation. Chest:     Chest wall: Tenderness present.  Abdominal:     Palpations: Abdomen is soft.     Tenderness: There is no abdominal tenderness. There is no guarding or rebound.  Musculoskeletal:        General: No tenderness. Normal range of motion.     Cervical back: Normal range of motion and neck supple.  Skin:    General: Skin is warm.  Neurological:     Mental Status: She is alert and oriented to person, place, and time.     Cranial Nerves: No cranial nerve deficit.     Motor: No abnormal muscle  tone.     Coordination: Coordination normal.     Comments:  5/5 strength throughout. CN 2-12 intact.Equal grip strength.   Psychiatric:        Behavior: Behavior normal.    ED Results / Procedures / Treatments   Labs (all labs ordered are listed, but only abnormal results are displayed) Labs Reviewed  D-DIMER, QUANTITATIVE -  Abnormal; Notable for the following components:      Result Value   D-Dimer, Quant 0.53 (*)    All other components within normal limits  HEPATIC FUNCTION PANEL - Abnormal; Notable for the following components:   Indirect Bilirubin 1.0 (*)    All other components within normal limits  BASIC METABOLIC PANEL  CBC  LIPASE, BLOOD  BRAIN NATRIURETIC PEPTIDE  TROPONIN I (HIGH SENSITIVITY)  TROPONIN I (HIGH SENSITIVITY)  TROPONIN I (HIGH SENSITIVITY)  TROPONIN I (HIGH SENSITIVITY)    EKG EKG Interpretation  Date/Time:  Thursday July 25 2020 15:28:46 EDT Ventricular Rate:  95 PR Interval:  156 QRS Duration: 96 QT Interval:  368 QTC Calculation: 462 R Axis:   -3 Text Interpretation: Normal sinus rhythm Incomplete right bundle branch block Borderline ECG No previous ECGs available Confirmed by Glynn Octave [phone]) on 07/26/2020 5:04:28 AM  Radiology DG Chest 2 View  Result Date: 07/25/2020 CLINICAL DATA:  60 year old female with shortness of breath. EXAM: CHEST - 2 VIEW COMPARISON:  Chest radiograph dated 10/25/2017. FINDINGS: No focal consolidation, pleural effusion, pneumothorax. Minimal bibasilar atelectasis. Stable mild cardiomegaly. No acute osseous pathology. IMPRESSION: No active cardiopulmonary disease. Electronically Signed   By: Elgie Collard M.D.   On: 07/25/2020 17:32    Procedures Procedures   Medications Ordered in ED Medications  ketorolac (TORADOL) 30 MG/ML injection 30 mg (has no administration in time range)    ED Course  I have reviewed the triage vital signs and the nursing notes.  Pertinent labs & imaging results that were  available during my care of the patient were reviewed by me and considered in my medical decision making (see chart for details).    MDM Rules/Calculators/A&P                         Greater than 24 hours of constant chest pressure, tightness and shortness of breath.  Recent COVID diagnosis. On augmentin for sinusitis currently. Her EKG is nonischemic.  Chest x-ray is negative.  With ongoing pain for greater than 24 hours negative troponins, ACS seems less likely.  Pain is somewhat reproducible  Negative stress test in 2018.  Patient's pain is somewhat reproducible. Improved with toradol. Her initial troponin is negative.  Second troponin is pending.  She did have 2 troponins but they were only 19 minutes apart.  Has been waiting in the department for over 14 hours.  Chest x-ray is negative.  Age-adjusted D-dimer is negative with low suspicion for PE or aortic dissection.  LFTs and lipase are normal. Heart score 3.  Patient will be referred back to her PCP and cardiology for stress test.  Discussed that she is low risk for heart disease but not 0 risk.  Recommended follow-up with cardiology for stress test.  Return to the ED with chest pain becomes exertional, associated shortness of breath, nausea, vomiting, sweating, or other concerns Dr. Madilyn Hook to assume care at shift change pending second troponin.  Diana Hamilton was evaluated in Emergency Department on 07/26/2020 for the symptoms described in the history of present illness. She was evaluated in the context of the global COVID-19 pandemic, which necessitated consideration that the patient might be at risk for infection with the SARS-CoV-2 virus that causes COVID-19. Institutional protocols and algorithms that pertain to the evaluation of patients at risk for COVID-19 are in a state of rapid change based on information released by regulatory bodies including the CDC and federal and state  organizations. These policies and algorithms were followed  during the patient's care in the ED.  Final Clinical Impression(s) / ED Diagnoses Final diagnoses:  Nonspecific chest pain  Elevated blood pressure reading    Rx / DC Orders ED Discharge Orders     None        Jonerik Sliker, Jeannett SeniorStephen, MD 07/26/20 [phone]

## 2020-08-01 ENCOUNTER — Other Ambulatory Visit: Payer: Self-pay

## 2020-08-01 ENCOUNTER — Ambulatory Visit (INDEPENDENT_AMBULATORY_CARE_PROVIDER_SITE_OTHER): Payer: 59 | Admitting: Family Medicine

## 2020-08-01 ENCOUNTER — Encounter: Payer: Self-pay | Admitting: Family Medicine

## 2020-08-01 VITALS — BP 148/82 | HR 84 | Temp 99.2°F | Resp 16 | Ht 64.0 in | Wt 265.0 lb

## 2020-08-01 DIAGNOSIS — R0600 Dyspnea, unspecified: Secondary | ICD-10-CM | POA: Diagnosis not present

## 2020-08-01 DIAGNOSIS — R0609 Other forms of dyspnea: Secondary | ICD-10-CM

## 2020-08-01 MED ORDER — PREDNISONE 20 MG PO TABS: ORAL_TABLET | ORAL | 0 refills | Status: DC

## 2020-08-01 MED ORDER — AMLODIPINE BESYLATE 5 MG PO TABS: 5.0000 mg | ORAL_TABLET | Freq: Every day | ORAL | 3 refills | Status: DC

## 2020-08-02 NOTE — Progress Notes (Signed)
Subjective:    Patient ID: Diana Hamilton, female    DOB: 02-May-1960, 60 y.o.   MRN: [id number]  HPI Patient recently went to the emergency room with chest pain.  Diagnosed with COVID 1 month ago.  Patient developed pain in her chest.  The pain was located substernal.  It was a pressure-like sensation.  It lasted all day while she was at work.  She went to the emergency room.  Chest x-ray showed atelectasis.  D-dimer was mildly elevated at 0.53 but felt to be not clinically significant.  Troponins were negative.  EKGs were normal.  Patient was recommended to follow-up with me and with her cardiologist.  Today the patient has reproducible chest pain with palpation of the costochondral junction.  She also reports pleurisy.  She reports a burning sensation whenever she takes a deep breath in.  She also is having a cough triggered by deep inspiration.  She denies any hemoptysis.  She denies any fevers or chills.  She does report dyspnea on exertion.  She states that the dyspnea on exertion preceded COVID.  She states that she becomes easily winded and tachycardic simply walking into her office at work.  This is very minimal activity.  She denies any angina today.  Had a negative stress test approximately 4 years ago   Past Medical History:  Diagnosis Date   Chronic venous insufficiency    DDD (degenerative disc disease)    Hyperlipidemia    Hypertension    Peripheral neuropathy    Positive ANA (antinuclear antibody)    Thyroid disease    Past Surgical History:  Procedure Laterality Date   ABDOMINAL HYSTERECTOMY  01/2006   fibroids   Current Outpatient Medications on File Prior to Visit  Medication Sig Dispense Refill   albuterol (VENTOLIN HFA) 108 (90 Base) MCG/ACT inhaler Inhale into the lungs every 6 (six) hours as needed for wheezing or shortness of breath.     amoxicillin-clavulanate (AUGMENTIN) 875-125 MG tablet Take 1 tablet by mouth 2 (two) times daily. Finished this AM for sinus  infection     diclofenac (VOLTAREN) 75 MG EC tablet Take 1 tablet (75 mg total) by mouth 2 (two) times daily. 90 tablet 1   hydrochlorothiazide (HYDRODIURIL) 25 MG tablet Take 1 tablet (25 mg total) by mouth daily. 90 tablet 2   levothyroxine (SYNTHROID) 25 MCG tablet TAKE 1 TABLET DAILY BEFORE BREAKFAST 90 tablet 2   losartan (COZAAR) 100 MG tablet Take 1 tablet (100 mg total) by mouth daily. 90 tablet 2   No current facility-administered medications on file prior to visit.   Allergies  Allergen Reactions   Lisinopril Cough   Sulfa Antibiotics    Social History   Socioeconomic History   Marital status: Married    Spouse name: Not on file   Number of children: 2   Years of education: Not on file   Highest education level: Not on file  Occupational History   Occupation: textile  Tobacco Use   Smoking status: Never   Smokeless tobacco: Never  Substance and Sexual Activity   Alcohol use: No   Drug use: No   Sexual activity: Not on file  Other Topics Concern   Not on file  Social History Narrative   Not on file   Social Determinants of Health   Financial Resource Strain: Not on file  Food Insecurity: Not on file  Transportation Needs: Not on file  Physical Activity: Not on file  Stress: Not  on file  Social Connections: Not on file  Intimate Partner Violence: Not on file   Family History  Problem Relation Age of Onset   Heart disease Mother        arrhythmia   Cancer Mother 66       breast   Breast cancer Mother    Hypertension Father    Stroke Father    Cancer Sister 61       breast   Breast cancer Maternal Aunt    Breast cancer Paternal Aunt    Breast cancer Paternal Aunt    Breast cancer Paternal Aunt      Review of Systems     Objective:   Physical Exam Vitals reviewed.  Constitutional:      General: She is not in acute distress.    Appearance: Normal appearance. She is obese. She is not ill-appearing or toxic-appearing.  Cardiovascular:      Rate and Rhythm: Normal rate and regular rhythm.     Pulses: Normal pulses.     Heart sounds: Normal heart sounds. No murmur heard.   No friction rub. No gallop.  Pulmonary:     Effort: Pulmonary effort is normal. No respiratory distress.     Breath sounds: Normal breath sounds. No stridor. No wheezing, rhonchi or rales.  Chest:     Chest wall: Tenderness present.    Abdominal:     General: Abdomen is flat. Bowel sounds are normal.     Palpations: Abdomen is soft.  Musculoskeletal:     Right lower leg: No edema.     Left lower leg: No edema.  Neurological:     Mental Status: She is alert.          Assessment & Plan:  Dyspnea on exertion - Plan: Ambulatory referral to Cardiology Patient has pleurisy, costochondritis, and cough.  I believe this is most likely post viral inflammation.  I will treat the costochondritis with a prednisone taper pack.  Reassess next week to see how the patient is doing.  Meanwhile start amlodipine 5 mg a day for hypertension.  I believe her dyspnea on exertion is most likely due to deconditioning and elevated BMI.  However I would like the patient to follow-up with her cardiologist as I feel that she would likely benefit from an echocardiogram and perhaps a stress test.  I do not believe that the patient has a pulmonary embolism based on her exam today as she is comfortable.  The pleurisy is extremely mild.  The chest pain is slightly improving.  Furthermore it is reproducible with palpation.

## 2020-09-26 ENCOUNTER — Other Ambulatory Visit: Payer: Self-pay | Admitting: Family Medicine

## 2020-10-04 NOTE — Progress Notes (Signed)
Cardiology Office Note   Date:  10/07/2020   ID:  Diana Hamilton, DOB 1960-11-19, MRN [id number]  PCP:  Diana Brooks, MD  Cardiologist:   Diana Crossland [place], MD   Chief Complaint  Patient presents with   Shortness of Breath      History of Present Illness: Diana Hamilton is a 59 y.o. female who is seen at the request of Dr Diana Hamilton for evaluation of DOE. She has a history of HTN and HLD. She was seen by me in 2018 for similar complaints. Echo and Myoview were unremarkable. Dyspnea felt to be related to HTN and obesity.   She did have Covid 19 infection in May. Had SOB, fatigue and cough. Seen in the ED on 07/26/20 with chest pain, dyspnea and cough. CXR showed NAD. Troponins negative. BNP 20.7. Ecg stable. Felt to have costochondritis. Treated with steroids. Still notes some cough and SOB. Has gained weight ( up 22 lbs since last seen by me). No edema. No palpitations. No chest pain.    Past Medical History:  Diagnosis Date   Chronic venous insufficiency    DDD (degenerative disc disease)    Hyperlipidemia    Hypertension    Peripheral neuropathy    Positive ANA (antinuclear antibody)    Thyroid disease     Past Surgical History:  Procedure Laterality Date   ABDOMINAL HYSTERECTOMY  01/2006   fibroids     Current Outpatient Medications  Medication Sig Dispense Refill   albuterol (VENTOLIN HFA) 108 (90 Base) MCG/ACT inhaler Inhale into the lungs every 6 (six) hours as needed for wheezing or shortness of breath.     amLODipine (NORVASC) 5 MG tablet Take 1 tablet (5 mg total) by mouth daily. 90 tablet 3   diclofenac (VOLTAREN) 75 MG EC tablet TAKE 1 TABLET TWICE A DAY 90 tablet 7   hydrochlorothiazide (HYDRODIURIL) 25 MG tablet Take 1 tablet (25 mg total) by mouth daily. 90 tablet 2   levothyroxine (SYNTHROID) 25 MCG tablet TAKE 1 TABLET DAILY BEFORE BREAKFAST 90 tablet 2   losartan (COZAAR) 100 MG tablet Take 1 tablet (100 mg total) by mouth daily. 90 tablet 2   No  current facility-administered medications for this visit.    Allergies:   Lisinopril and Sulfa antibiotics    Social History:  The patient  reports that she has never smoked. She has never used smokeless tobacco. She reports that she does not drink alcohol and does not use drugs.   Family History:  The patient's family history includes Breast cancer in her maternal aunt, mother, paternal aunt, paternal aunt, and paternal aunt; Cancer (age of onset: 74) in her sister; Cancer (age of onset: 68) in her mother; Heart disease in her mother; Hypertension in her father; Stroke in her father.    ROS:  Please see the history of present illness.   Otherwise, review of systems are positive for none.   All other systems are reviewed and negative.    PHYSICAL EXAM: VS:  BP (!) 150/80   Pulse 84   Ht 5\' 6"  (1.676 m)   Wt 276 lb (125.2 kg)   SpO2 97%   BMI 44.55 kg/m  , BMI Body mass index is 44.55 kg/m. GEN: Well nourished, well developed, in no acute distress HEENT: normal Neck: no JVD, carotid bruits, or masses Cardiac: RRR; no murmurs, rubs, or gallops,no edema  Respiratory:  clear to auscultation bilaterally, normal work of breathing GI: soft, nontender, nondistended, +  BS MS: no deformity or atrophy Skin: warm and dry, no rash Neuro:  Strength and sensation are intact Psych: euthymic mood, full affect   EKG:  EKG is ordered today. The ekg ordered today demonstrates NSR rate 84, incomplete RBBB. Otherwise normal. I have personally reviewed and interpreted this study.    Recent Labs: 07/25/2020: BUN 20; Creatinine, Ser 0.75; Hemoglobin 13.5; Platelets 309; Potassium 3.8; Sodium 139 07/26/2020: ALT 33; B Natriuretic Peptide 20.7    Lipid Panel    Component Value Date/Time   CHOL 169 04/20/2018 1551   TRIG 118 04/20/2018 1551   HDL 47 (L) 04/20/2018 1551   CHOLHDL 3.6 04/20/2018 1551   VLDL 12 04/08/2016 1544   LDLCALC 101 (H) 04/20/2018 1551      Wt Readings from Last 3  Encounters:  10/07/20 276 lb (125.2 kg)  08/01/20 265 lb (120.2 kg)  07/25/20 263 lb (119.3 kg)      Other studies Reviewed: Additional studies/ records that were reviewed today include:   Myoview 06/05/16: Study Highlights  The left ventricular ejection fraction is hyperdynamic (>65%). Nuclear stress EF: 71%. The study is normal. This is a low risk study.     Echo 05/14/16: Study Conclusions   - Left ventricle: The cavity size was normal. There was mild    concentric hypertrophy. Systolic function was normal. The    estimated ejection fraction was in the range of 60% to 65%. Wall    motion was normal; there were no regional wall motion    abnormalities. Features are consistent with a pseudonormal left    ventricular filling pattern, with concomitant abnormal relaxation    and increased filling pressure (grade 2 diastolic dysfunction).  - Left atrium: The atrium was mildly dilated.  - Right atrium: The atrium was mildly dilated.  - Atrial septum: No defect or patent foramen ovale was identified   ASSESSMENT AND PLAN:  1.  Dyspnea. Post Covid infection in May with subsequent costochondritis. I suspect her symptoms are largely related to that and her weight gain. Nothing to indicate any new cardiac issues. Evaluation with Echo and stress myoview in 2018 were negative. At this point I think she is low risk. She would like to avoid additional work up and I agree. Work on conditioning and weight loss. I am happy to see her back if symptoms change.    Current medicines are reviewed at length with the patient today.  The patient does not have concerns regarding medicines.  The following changes have been made:  no change  Labs/ tests ordered today include:  No orders of the defined types were placed in this encounter.    Disposition:   FU PRN  Signed, Diana Grine [place], MD  10/07/2020 4:22 PM    [facility] [address] [place], [place], [address] Phone  [phone], Fax [phone]

## 2020-10-07 ENCOUNTER — Other Ambulatory Visit: Payer: Self-pay

## 2020-10-07 ENCOUNTER — Ambulatory Visit (INDEPENDENT_AMBULATORY_CARE_PROVIDER_SITE_OTHER): Payer: 59 | Admitting: Cardiology

## 2020-10-07 ENCOUNTER — Encounter: Payer: Self-pay | Admitting: Cardiology

## 2020-10-07 VITALS — BP 150/80 | HR 84 | Ht 66.0 in | Wt 276.0 lb

## 2020-10-07 DIAGNOSIS — R0609 Other forms of dyspnea: Secondary | ICD-10-CM

## 2020-10-07 DIAGNOSIS — R0600 Dyspnea, unspecified: Secondary | ICD-10-CM

## 2020-11-05 ENCOUNTER — Telehealth: Payer: Self-pay | Admitting: Family Medicine

## 2020-11-05 NOTE — Telephone Encounter (Signed)
Patient dropped off wellness preventive care form requiring provider's signature. Requesting form asap; patient just found out about it and it's due by 9/30. Please fax to Gabriel Carina at [phone].  Form placed in hanging folder beside Christina's desk.

## 2020-11-05 NOTE — Telephone Encounter (Signed)
Patient has not had CPE or routine labs since .04/2018.  ER F/U was done in June 2022  Routed to PCP.

## 2021-01-06 ENCOUNTER — Other Ambulatory Visit: Payer: Self-pay | Admitting: Family Medicine

## 2021-01-17 MED ORDER — AMLODIPINE BESYLATE 5 MG PO TABS: 5.0000 mg | ORAL_TABLET | Freq: Every day | ORAL | 2 refills | Status: DC

## 2021-01-17 NOTE — Addendum Note (Signed)
Addended by: Grier Rocher on: 01/17/2021 12:24 PM   Modules accepted: Orders

## 2021-02-21 ENCOUNTER — Other Ambulatory Visit: Payer: Self-pay | Admitting: Family Medicine

## 2021-03-07 ENCOUNTER — Other Ambulatory Visit: Payer: Self-pay | Admitting: Family Medicine

## 2021-04-02 ENCOUNTER — Other Ambulatory Visit: Payer: Self-pay | Admitting: Family Medicine

## 2021-04-02 ENCOUNTER — Other Ambulatory Visit: Payer: Self-pay

## 2021-05-02 ENCOUNTER — Ambulatory Visit (INDEPENDENT_AMBULATORY_CARE_PROVIDER_SITE_OTHER): Payer: 59 | Admitting: Family Medicine

## 2021-05-02 ENCOUNTER — Other Ambulatory Visit: Payer: Self-pay

## 2021-05-02 VITALS — BP 158/102 | HR 99 | Temp 97.4°F | Ht 67.0 in | Wt 286.8 lb

## 2021-05-02 DIAGNOSIS — I10 Essential (primary) hypertension: Secondary | ICD-10-CM | POA: Diagnosis not present

## 2021-05-02 DIAGNOSIS — J310 Chronic rhinitis: Secondary | ICD-10-CM

## 2021-05-02 DIAGNOSIS — J329 Chronic sinusitis, unspecified: Secondary | ICD-10-CM | POA: Diagnosis not present

## 2021-05-02 DIAGNOSIS — H1033 Unspecified acute conjunctivitis, bilateral: Secondary | ICD-10-CM | POA: Diagnosis not present

## 2021-05-02 MED ORDER — AMOXICILLIN 875 MG PO TABS: 875.0000 mg | ORAL_TABLET | Freq: Two times a day (BID) | ORAL | 0 refills | Status: AC

## 2021-05-02 MED ORDER — POLYMYXIN B-TRIMETHOPRIM 10000-0.1 UNIT/ML-% OP SOLN: 2.0000 [drp] | Freq: Four times a day (QID) | OPHTHALMIC | 0 refills | Status: DC

## 2021-05-02 NOTE — Progress Notes (Signed)
? ?+ ? ?Subjective:  ? ? Patient ID: Diana Hamilton, female    DOB: [birth year], 61 y.o.   MRN: [id number] ? ?Sinus Problem ?Patient presents with 7 days of pain in both maxillary and both frontal sinuses.  She also has severe bilateral conjunctivitis.  Both eyes are blood red and weeping purulent discharge.  She reports fevers and headaches.  She reports rhinorrhea and congestion despite taking loratadine and Zyrtec.  Her blood pressure is also very high today at 158/102.  She has been taking her amlodipine and her losartan but she has not been taking hydrochlorothiazide due to polyuria. ? ? ?Past Medical History:  ?Diagnosis Date  ? Chronic venous insufficiency   ? DDD (degenerative disc disease)   ? Hyperlipidemia   ? Hypertension   ? Peripheral neuropathy   ? Positive ANA (antinuclear antibody)   ? Thyroid disease   ? ?Past Surgical History:  ?Procedure Laterality Date  ? ABDOMINAL HYSTERECTOMY  01/2006  ? fibroids  ? ?Current Outpatient Medications on File Prior to Visit  ?Medication Sig Dispense Refill  ? albuterol (VENTOLIN HFA) 108 (90 Base) MCG/ACT inhaler Inhale into the lungs every 6 (six) hours as needed for wheezing or shortness of breath.    ? amLODipine (NORVASC) 10 MG tablet Take 10 mg by mouth daily.    ? cetirizine (ZYRTEC) 10 MG tablet Take 10 mg by mouth at bedtime.    ? diclofenac (VOLTAREN) 75 MG EC tablet TAKE 1 TABLET TWICE A DAY 90 tablet 7  ? hydrochlorothiazide (HYDRODIURIL) 25 MG tablet TAKE 1 TABLET DAILY 90 tablet 0  ? levothyroxine (SYNTHROID) 25 MCG tablet TAKE 1 TABLET DAILY BEFORE BREAKFAST (NEED APPOINTMENT/OFFICE VISIT BEFORE ANYMORE REFILLS) 30 tablet 11  ? loratadine (CLARITIN) 10 MG tablet Take 10 mg by mouth every morning.    ? losartan (COZAAR) 100 MG tablet TAKE 1 TABLET DAILY 90 tablet 3  ? ?No current facility-administered medications on file prior to visit.  ? ?Allergies  ?Allergen Reactions  ? Lisinopril Cough  ? Sulfa Antibiotics   ? ?Social History  ? ?Socioeconomic  History  ? Marital status: Married  ?  Spouse name: Not on file  ? Number of children: 2  ? Years of education: Not on file  ? Highest education level: Not on file  ?Occupational History  ? Occupation: textile  ?Tobacco Use  ? Smoking status: Never  ? Smokeless tobacco: Never  ?Substance and Sexual Activity  ? Alcohol use: No  ? Drug use: No  ? Sexual activity: Not on file  ?Other Topics Concern  ? Not on file  ?Social History Narrative  ? Not on file  ? ?Social Determinants of Health  ? ?Financial Resource Strain: Not on file  ?Food Insecurity: Not on file  ?Transportation Needs: Not on file  ?Physical Activity: Not on file  ?Stress: Not on file  ?Social Connections: Not on file  ?Intimate Partner Violence: Not on file  ? ?Family History  ?Problem Relation Age of Onset  ? Heart disease Mother   ?     arrhythmia  ? Cancer Mother 53  ?     breast  ? Breast cancer Mother   ? Hypertension Father   ? Stroke Father   ? Cancer Sister 59  ?     breast  ? Breast cancer Maternal Aunt   ? Breast cancer Paternal Aunt   ? Breast cancer Paternal Aunt   ? Breast cancer Paternal Aunt   ? ? ? ?  Review of Systems ? ?   ?Objective:  ? Physical Exam ?Vitals reviewed.  ?Constitutional:   ?   General: She is not in acute distress. ?   Appearance: Normal appearance. She is obese. She is not ill-appearing or toxic-appearing.  ?HENT:  ?   Right Ear: Tympanic membrane and ear canal normal.  ?   Left Ear: Tympanic membrane and ear canal normal.  ?   Nose: Congestion and rhinorrhea present.  ?   Right Sinus: Maxillary sinus tenderness and frontal sinus tenderness present.  ?   Left Sinus: Maxillary sinus tenderness and frontal sinus tenderness present.  ?Eyes:  ?   Conjunctiva/sclera:  ?   Right eye: Right conjunctiva is injected. Exudate present.  ?   Left eye: Left conjunctiva is injected. Exudate present.  ?Cardiovascular:  ?   Rate and Rhythm: Normal rate and regular rhythm.  ?   Pulses: Normal pulses.  ?   Heart sounds: Normal heart  sounds. No murmur heard. ?  No friction rub. No gallop.  ?Pulmonary:  ?   Effort: Pulmonary effort is normal. No respiratory distress.  ?   Breath sounds: Normal breath sounds. No stridor. No wheezing, rhonchi or rales.  ?Musculoskeletal:  ?   Right lower leg: No edema.  ?   Left lower leg: No edema.  ?Neurological:  ?   Mental Status: She is alert.  ? ? ? ? ? ?   ?Assessment & Plan:  ?Essential hypertension ? ?Rhinosinusitis ? ?Acute bacterial conjunctivitis of both eyes ?Patient has a sinus infection.  Begin amoxicillin 875 mg twice daily for 10 days.  She also has severe conjunctivitis.  Begin Polytrim 2 drops each eye every 6 hours for the next 5 days.  Resume hydrochlorothiazide 25 mg daily and recheck blood pressure in 2 weeks. ?

## 2021-05-23 ENCOUNTER — Other Ambulatory Visit: Payer: Self-pay | Admitting: Family Medicine

## 2021-05-23 ENCOUNTER — Ambulatory Visit
Admission: RE | Admit: 2021-05-23 | Discharge: 2021-05-23 | Disposition: A | Discharge status: Home / Self Care | Payer: 59 | Source: Ambulatory Visit | Attending: Family Medicine | Admitting: Family Medicine

## 2021-05-23 ENCOUNTER — Ambulatory Visit (INDEPENDENT_AMBULATORY_CARE_PROVIDER_SITE_OTHER): Payer: 59 | Admitting: Family Medicine

## 2021-05-23 VITALS — BP 142/90 | HR 100 | Temp 97.7°F | Ht 67.0 in | Wt 285.0 lb

## 2021-05-23 DIAGNOSIS — I10 Essential (primary) hypertension: Secondary | ICD-10-CM | POA: Diagnosis not present

## 2021-05-23 DIAGNOSIS — Z1231 Encounter for screening mammogram for malignant neoplasm of breast: Secondary | ICD-10-CM

## 2021-05-23 DIAGNOSIS — E038 Other specified hypothyroidism: Secondary | ICD-10-CM

## 2021-05-23 DIAGNOSIS — Z6841 Body Mass Index (BMI) 40.0 and over, adult: Secondary | ICD-10-CM

## 2021-05-23 DIAGNOSIS — Z0000 Encounter for general adult medical examination without abnormal findings: Secondary | ICD-10-CM | POA: Diagnosis not present

## 2021-05-23 MED ORDER — WEGOVY 0.5 MG/0.5ML ~~LOC~~ SOAJ: 0.5000 mg | SUBCUTANEOUS | 0 refills | Status: DC

## 2021-05-23 MED ORDER — CEFDINIR 300 MG PO CAPS: 300.0000 mg | ORAL_CAPSULE | Freq: Two times a day (BID) | ORAL | 0 refills | Status: DC

## 2021-05-23 MED ORDER — PREDNISONE 20 MG PO TABS: ORAL_TABLET | ORAL | 0 refills | Status: DC

## 2021-05-23 NOTE — Progress Notes (Signed)
? ?Subjective:  ? ? Patient ID: Diana Hamilton, female    DOB: 05/25/1960, 61 y.o.   MRN: [id number] ? ?HPI ?Patient is a very sweet 61 year old Caucasian female here today for complete physical exam.  Her last mammogram was 2021 and she is due for this.  Her last colonoscopy was 2014 and is not due again until next year.  She is has a history of a partial hysterectomy and therefore she does not require Pap smear.  Her BMI today is elevated at 44.  Despite trying diet exercise, the patient has been unable to successfully lose weight.  She has serious health conditions related to this including hypertension.  She has been trying a low carbohydrate diet unsuccessfully.  She is very much interested in trying Ozempic.  She continues to deal with a sinus infection.  Her right eardrum is completely occluded due to eustachian tube dysfunction.  She reports postnasal drip and headache and head congestion.  She denies any fevers or chest pain or shortness of breath. ? ? ?Immunization History  ?Administered Date(s) Administered  ? Hepatitis A 07/03/2005  ? Influenza,inj,Quad PF,6+ Mos 04/11/2015, 04/09/2016, 04/22/2018  ? Tdap 08/08/2010, 04/13/2016  ? Typhoid Inactivated 07/03/2005  ? ? ?Past Medical History:  ?Diagnosis Date  ? Chronic venous insufficiency   ? DDD (degenerative disc disease)   ? Hyperlipidemia   ? Hypertension   ? Peripheral neuropathy   ? Positive ANA (antinuclear antibody)   ? Thyroid disease   ? ?Past Surgical History:  ?Procedure Laterality Date  ? ABDOMINAL HYSTERECTOMY  01/2006  ? fibroids  ? ?Current Outpatient Medications on File Prior to Visit  ?Medication Sig Dispense Refill  ? albuterol (VENTOLIN HFA) 108 (90 Base) MCG/ACT inhaler Inhale into the lungs every 6 (six) hours as needed for wheezing or shortness of breath.    ? amLODipine (NORVASC) 10 MG tablet Take 10 mg by mouth daily.    ? cetirizine (ZYRTEC) 10 MG tablet Take 10 mg by mouth at bedtime.    ? diclofenac (VOLTAREN) 75 MG EC tablet TAKE  1 TABLET TWICE A DAY 90 tablet 7  ? hydrochlorothiazide (HYDRODIURIL) 25 MG tablet TAKE 1 TABLET DAILY 90 tablet 0  ? levothyroxine (SYNTHROID) 25 MCG tablet TAKE 1 TABLET DAILY BEFORE BREAKFAST (NEED APPOINTMENT/OFFICE VISIT BEFORE ANYMORE REFILLS) 30 tablet 11  ? loratadine (CLARITIN) 10 MG tablet Take 10 mg by mouth every morning.    ? losartan (COZAAR) 100 MG tablet TAKE 1 TABLET DAILY 90 tablet 3  ? trimethoprim-polymyxin b (POLYTRIM) ophthalmic solution Place 2 drops into both eyes every 6 (six) hours. 10 mL 0  ? ?No current facility-administered medications on file prior to visit.  ? ?Allergies  ?Allergen Reactions  ? Lisinopril Cough  ? Sulfa Antibiotics   ? ?Social History  ? ?Socioeconomic History  ? Marital status: Married  ?  Spouse name: Not on file  ? Number of children: 2  ? Years of education: Not on file  ? Highest education level: Not on file  ?Occupational History  ? Occupation: textile  ?Tobacco Use  ? Smoking status: Never  ? Smokeless tobacco: Never  ?Substance and Sexual Activity  ? Alcohol use: No  ? Drug use: No  ? Sexual activity: Not on file  ?Other Topics Concern  ? Not on file  ?Social History Narrative  ? Not on file  ? ?Social Determinants of Health  ? ?Financial Resource Strain: Not on file  ?Food Insecurity: Not on file  ?  Transportation Needs: Not on file  ?Physical Activity: Not on file  ?Stress: Not on file  ?Social Connections: Not on file  ?Intimate Partner Violence: Not on file  ? ?Family History  ?Problem Relation Age of Onset  ? Heart disease Mother   ?     arrhythmia  ? Cancer Mother 61  ?     breast  ? Breast cancer Mother   ? Hypertension Father   ? Stroke Father   ? Cancer Sister 73  ?     breast  ? Breast cancer Maternal Aunt   ? Breast cancer Paternal Aunt   ? Breast cancer Paternal Aunt   ? Breast cancer Paternal Aunt   ? ? ? ?Review of Systems  ?All other systems reviewed and are negative. ? ?   ?Objective:  ? Physical Exam ?Vitals reviewed.  ?Constitutional:   ?    General: She is not in acute distress. ?   Appearance: She is well-developed. She is not diaphoretic.  ?HENT:  ?   Head: Normocephalic and atraumatic.  ?   Right Ear: External ear normal.  ?   Left Ear: External ear normal.  ?   Nose: Nose normal.  ?   Mouth/Throat:  ?   Pharynx: No oropharyngeal exudate.  ?Eyes:  ?   General: No scleral icterus.    ?   Right eye: No discharge.     ?   Left eye: No discharge.  ?   Conjunctiva/sclera: Conjunctivae normal.  ?   Pupils: Pupils are equal, round, and reactive to light.  ?Neck:  ?   Thyroid: No thyromegaly.  ?   Vascular: No JVD.  ?   Trachea: No tracheal deviation.  ?Cardiovascular:  ?   Rate and Rhythm: Normal rate and regular rhythm.  ?   Heart sounds: Normal heart sounds. No murmur heard. ?  No friction rub.  ?Pulmonary:  ?   Effort: Pulmonary effort is normal. No respiratory distress.  ?   Breath sounds: Normal breath sounds. No stridor. No wheezing or rales.  ?Chest:  ?   Chest wall: No tenderness.  ?Abdominal:  ?   General: Bowel sounds are normal. There is no distension.  ?   Palpations: Abdomen is soft. There is no mass.  ?   Tenderness: There is no abdominal tenderness. There is no guarding or rebound.  ?Musculoskeletal:     ?   General: No tenderness. Normal range of motion.  ?   Cervical back: Normal range of motion and neck supple.  ?Lymphadenopathy:  ?   Cervical: No cervical adenopathy.  ?Skin: ?   General: Skin is warm.  ?   Coloration: Skin is not pale.  ?   Findings: No erythema or rash.  ?Neurological:  ?   Mental Status: She is alert and oriented to person, place, and time.  ?   Cranial Nerves: No cranial nerve deficit.  ?   Motor: No abnormal muscle tone.  ?   Coordination: Coordination normal.  ?   Deep Tendon Reflexes: Reflexes are normal and symmetric.  ?Psychiatric:     ?   Behavior: Behavior normal.     ?   Thought Content: Thought content normal.     ?   Judgment: Judgment normal.  ? ? ? ? ? ?   ?Assessment & Plan:  ?General medical exam - Plan:  CBC with Differential/Platelet, Lipid panel, COMPLETE METABOLIC PANEL WITH GFR, TSH ? ?Essential hypertension - Plan: CBC  with Differential/Platelet, Lipid panel, COMPLETE METABOLIC PANEL WITH GFR, TSH ? ?Other specified hypothyroidism - Plan: CBC with Differential/Platelet, Lipid panel, COMPLETE METABOLIC PANEL WITH GFR, TSH ? ?Encounter for screening mammogram for malignant neoplasm of breast - Plan: MM Digital Screening ? ?Class 3 severe obesity with serious comorbidity and body mass index (BMI) of 40.0 to 44.9 in adult, unspecified obesity type (HCC) ?My biggest concern for this patient's long-term health is her BMI and her hypertension.  Blood pressure today is borderline.  Therefore we will work on weight loss to try to help manage the blood pressure.  I gave the patient a prescription for Ozempic 0.5 mg subcu weekly.  She will try this for a month and then we will increase the dose as the patient can tolerate up to a maximum 2.4 mg subcu weekly.  She will continue to use a low carbohydrate diet and exercise.  I will check a CBC CMP lipid panel and TSH today.  She has a history of hypothyroidism and she is due to check her TSH along with her cholesterol.  I will schedule the patient for mammogram.  Colonoscopy is not due until next year.  Recheck patient's weight in 6 weeks to ensure that the medicine is helping.  I will give the patient Omnicef 300 mg twice daily for 10 days coupled with a prednisone taper pack for refractory sinusitis ?

## 2021-05-24 LAB — CBC WITH DIFFERENTIAL/PLATELET
Absolute Monocytes: 689 cells/uL (ref 200–950)
Basophils Absolute: 50 cells/uL (ref 0–200)
Basophils Relative: 0.6 %
Eosinophils Absolute: 143 cells/uL (ref 15–500)
Eosinophils Relative: 1.7 %
HCT: 40.9 % (ref 35.0–45.0)
Hemoglobin: 13.6 g/dL (ref 11.7–15.5)
Lymphs Abs: 1537 cells/uL (ref 850–3900)
MCH: 28.9 pg (ref 27.0–33.0)
MCHC: 33.3 g/dL (ref 32.0–36.0)
MCV: 87 fL (ref 80.0–100.0)
MPV: 11.1 fL (ref 7.5–12.5)
Monocytes Relative: 8.2 %
Neutro Abs: 5981 cells/uL (ref 1500–7800)
Neutrophils Relative %: 71.2 %
Platelets: 278 10*3/uL (ref 140–400)
RBC: 4.7 10*6/uL (ref 3.80–5.10)
RDW: 13 % (ref 11.0–15.0)
Total Lymphocyte: 18.3 %
WBC: 8.4 10*3/uL (ref 3.8–10.8)

## 2021-05-24 LAB — COMPLETE METABOLIC PANEL WITH GFR
AG Ratio: 1.2 (calc) (ref 1.0–2.5)
ALT: 42 U/L — ABNORMAL HIGH (ref 6–29)
AST: 32 U/L (ref 10–35)
Albumin: 4.2 g/dL (ref 3.6–5.1)
Alkaline phosphatase (APISO): 71 U/L (ref 37–153)
BUN: 22 mg/dL (ref 7–25)
CO2: 27 mmol/L (ref 20–32)
Calcium: 9.7 mg/dL (ref 8.6–10.4)
Chloride: 103 mmol/L (ref 98–110)
Creat: 0.81 mg/dL (ref 0.50–1.03)
Globulin: 3.4 g/dL (calc) (ref 1.9–3.7)
Glucose, Bld: 106 mg/dL — ABNORMAL HIGH (ref 65–99)
Potassium: 3.6 mmol/L (ref 3.5–5.3)
Sodium: 141 mmol/L (ref 135–146)
Total Bilirubin: 0.6 mg/dL (ref 0.2–1.2)
Total Protein: 7.6 g/dL (ref 6.1–8.1)
eGFR: 84 mL/min/{1.73_m2} (ref >=60)

## 2021-05-24 LAB — LIPID PANEL
Cholesterol: 191 mg/dL (ref <200)
HDL: 55 mg/dL (ref >=50)
LDL Cholesterol (Calc): 117 mg/dL (calc) — ABNORMAL HIGH
Non-HDL Cholesterol (Calc): 136 mg/dL (calc) — ABNORMAL HIGH (ref <130)
Total CHOL/HDL Ratio: 3.5 (calc) (ref <5.0)
Triglycerides: 90 mg/dL (ref <150)

## 2021-05-24 LAB — TSH: TSH: 2.3 mIU/L (ref 0.40–4.50)

## 2021-05-27 ENCOUNTER — Other Ambulatory Visit: Payer: Self-pay | Admitting: Family Medicine

## 2021-06-04 ENCOUNTER — Encounter: Payer: Self-pay | Admitting: Family Medicine

## 2021-06-05 ENCOUNTER — Other Ambulatory Visit: Payer: Self-pay | Admitting: Family Medicine

## 2021-06-05 MED ORDER — HYDROCODONE BIT-HOMATROP MBR 5-1.5 MG/5ML PO SOLN: 5.0000 mL | Freq: Three times a day (TID) | ORAL | 0 refills | Status: DC | PRN

## 2021-06-09 ENCOUNTER — Encounter: Payer: Self-pay | Admitting: Family Medicine

## 2021-06-10 NOTE — Progress Notes (Signed)
Called LM for Pt to return call to make appt for husband for this morning due to being sick ?

## 2021-07-10 ENCOUNTER — Other Ambulatory Visit: Payer: Self-pay

## 2021-07-10 NOTE — Telephone Encounter (Signed)
Pt called in stating that she would like for this med levothyroxine (SYNTHROID) 25 MCG tablet [[id number]]   to [facility] [address] Pt stated that she does not want to use express script for this med any more. Pt is completely out of this med.Please advise.  Cb#: [phone]

## 2021-07-11 MED ORDER — LEVOTHYROXINE SODIUM 25 MCG PO TABS: ORAL_TABLET | ORAL | 7 refills | Status: DC

## 2021-07-11 NOTE — Telephone Encounter (Signed)
Pt does not want this med via mail order any longer. Wants local. This is what is left from current mail order rx. Requested Prescriptions  Pending Prescriptions Disp Refills  . levothyroxine (SYNTHROID) 25 MCG tablet 30 tablet 7    Sig: TAKE 1 TABLET DAILY BEFORE BREAKFAST (NEED APPOINTMENT/OFFICE VISIT BEFORE ANYMORE REFILLS)     Endocrinology:  Hypothyroid Agents Passed - 07/10/2021  2:21 PM      Passed - TSH in normal range and within 360 days    TSH  Date Value Ref Range Status  05/23/2021 2.30 0.40 - 4.50 mIU/L Final         Passed - Valid encounter within last 12 months    Recent Outpatient Visits          1 month ago General medical exam   [place] Susy Frizzle, MD   2 months ago Essential hypertension   [place], Cammie Mcgee, MD   11 months ago Dyspnea on exertion   [place] Dennard Schaumann Cammie Mcgee, MD   1 year ago Chronic pain of right knee   [place] [facility] Dennard Schaumann Cammie Mcgee, MD   3 years ago Breast cancer screening   [place] Pick[place], Cammie Mcgee, MD

## 2021-08-26 ENCOUNTER — Other Ambulatory Visit: Payer: Self-pay

## 2021-08-26 NOTE — Progress Notes (Signed)
Per pt don't want to refill with express script (Levothyroxine) disregard their request

## 2021-09-30 ENCOUNTER — Telehealth: Payer: Self-pay

## 2021-09-30 NOTE — Telephone Encounter (Signed)
CPE form put in Dr. Caren Macadam folder to be completed.   Will call pt as soon as its done

## 2021-10-05 ENCOUNTER — Other Ambulatory Visit: Payer: Self-pay | Admitting: Family Medicine

## 2021-10-06 NOTE — Telephone Encounter (Signed)
Requested medication (s) are due for refill today - expired Rx  Requested medication (s) are on the active medication list -yes  Future visit scheduled -no  Last refill: 09/26/20 #90 7RF  Notes to clinic: expired Rx  Requested Prescriptions  Pending Prescriptions Disp Refills   diclofenac (VOLTAREN) 75 MG EC tablet [Pharmacy Med Name: DICLOFENAC SODIUM DR TABS 75MG] 90 tablet 7    Sig: TAKE 1 TABLET TWICE A DAY     Analgesics:  NSAIDS Failed - 10/05/2021 11:31 AM      Failed - Manual Review: Labs are only required if the patient has taken medication for more than 8 weeks.      Passed - Cr in normal range and within 360 days    Creat  Date Value Ref Range Status  05/23/2021 0.81 0.50 - 1.03 mg/dL Final         Passed - HGB in normal range and within 360 days    Hemoglobin  Date Value Ref Range Status  05/23/2021 13.6 11.7 - 15.5 g/dL Final         Passed - PLT in normal range and within 360 days    Platelets  Date Value Ref Range Status  05/23/2021 278 140 - 400 Thousand/uL Final         Passed - HCT in normal range and within 360 days    HCT  Date Value Ref Range Status  05/23/2021 40.9 35.0 - 45.0 % Final         Passed - eGFR is 30 or above and within 360 days    GFR, Est African American  Date Value Ref Range Status  01/08/2020 99 > OR = 60 mL/min/1.17m Final   GFR, Est Non African American  Date Value Ref Range Status  01/08/2020 86 > OR = 60 mL/min/1.750mFinal   GFR, Estimated  Date Value Ref Range Status  07/25/2020 >60 >60 mL/min Final    Comment:    (NOTE) Calculated using the CKD-EPI Creatinine Equation (2021)    eGFR  Date Value Ref Range Status  05/23/2021 84 > OR = 60 mL/min/1.7368minal    Comment:    The eGFR is based on the CKD-EPI 2021 equation. To calculate  the new eGFR from a previous Creatinine or Cystatin C result, go to https://www.kidney.org/professionals/ kdoqi/gfr%5Fcalculator          Passed - Patient is not pregnant       Passed - Valid encounter within last 12 months    Recent Outpatient Visits           4 months ago General medical exam   [place] Scha[facility] Mcgee[place]   5 months ago Essential hypertension   [place] Scha[facility]ie McgeeD   1 year ago Dyspnea on exertion   [place] Schaumann[place]ammie McgeeD   1 year ago Chronic pain of right knee   [place] Schaumann[place]ammie McgeeD   3 years ago Breast cancer screening   [place]                 Requested Prescriptions  Pending Prescriptions Disp Refills   diclofenac (VOLTAREN) 75 MG EC tablet [Pharmacy Med Name: DICLOFENAC SODIUM DR TABS 75MG] 90 tablet 7    Sig: TAKE 1 TABLET TWICE A DAY     Analgesics:  NSAIDS Failed - 10/05/2021 11:31 AM  Failed - Manual Review: Labs are only required if the patient has taken medication for more than 8 weeks.      Passed - Cr in normal range and within 360 days    Creat  Date Value Ref Range Status  05/23/2021 0.81 0.50 - 1.03 mg/dL Final         Passed - HGB in normal range and within 360 days    Hemoglobin  Date Value Ref Range Status  05/23/2021 13.6 11.7 - 15.5 g/dL Final         Passed - PLT in normal range and within 360 days    Platelets  Date Value Ref Range Status  05/23/2021 278 140 - 400 Thousand/uL Final         Passed - HCT in normal range and within 360 days    HCT  Date Value Ref Range Status  05/23/2021 40.9 35.0 - 45.0 % Final         Passed - eGFR is 30 or above and within 360 days    GFR, Est African American  Date Value Ref Range Status  01/08/2020 99 > OR = 60 mL/min/1.65m Final   GFR, Est Non African American  Date Value Ref Range Status  01/08/2020 86 > OR = 60 mL/min/1.764mFinal   GFR, Estimated  Date Value Ref Range Status  07/25/2020 >60 >60 mL/min Final    Comment:    (NOTE) Calculated using the CKD-EPI Creatinine Equation  (2021)    eGFR  Date Value Ref Range Status  05/23/2021 84 > OR = 60 mL/min/1.7330minal    Comment:    The eGFR is based on the CKD-EPI 2021 equation. To calculate  the new eGFR from a previous Creatinine or Cystatin C result, go to https://www.kidney.org/professionals/ kdoqi/gfr%5Fcalculator          Passed - Patient is not pregnant      Passed - Valid encounter within last 12 months    Recent Outpatient Visits           4 months ago General medical exam   [place] Fri[place]   5 months ago Essential hypertension   [facility]ammie McgeeD   1 year ago Dyspnea on exertion   [place]enn[place] SchaumannrCammie McgeeD   1 year ago Chronic pain of right knee   [place]enn[place] SchaumannrCammie McgeeD   3 years ago Breast cancer screening   [place], [place] Mcgee[place]

## 2021-12-24 ENCOUNTER — Encounter: Payer: Self-pay | Admitting: Family Medicine

## 2021-12-25 ENCOUNTER — Other Ambulatory Visit: Payer: Self-pay

## 2021-12-25 ENCOUNTER — Encounter: Payer: Self-pay | Admitting: Family Medicine

## 2021-12-25 ENCOUNTER — Other Ambulatory Visit: Payer: Self-pay | Admitting: Family Medicine

## 2021-12-25 DIAGNOSIS — J329 Chronic sinusitis, unspecified: Secondary | ICD-10-CM

## 2021-12-25 MED ORDER — AMOXICILLIN 875 MG PO TABS: 875.0000 mg | ORAL_TABLET | Freq: Two times a day (BID) | ORAL | 0 refills | Status: AC

## 2021-12-25 MED ORDER — AMOXICILLIN 875 MG PO TABS: 875.0000 mg | ORAL_TABLET | Freq: Two times a day (BID) | ORAL | 0 refills | Status: DC

## 2022-01-09 ENCOUNTER — Other Ambulatory Visit: Payer: Self-pay

## 2022-01-09 ENCOUNTER — Encounter: Payer: Self-pay | Admitting: Family Medicine

## 2022-01-09 DIAGNOSIS — R768 Other specified abnormal immunological findings in serum: Secondary | ICD-10-CM

## 2022-01-09 DIAGNOSIS — E038 Other specified hypothyroidism: Secondary | ICD-10-CM

## 2022-01-09 MED ORDER — LEVOTHYROXINE SODIUM 25 MCG PO TABS: ORAL_TABLET | ORAL | 0 refills | Status: DC

## 2022-01-09 MED ORDER — DICLOFENAC SODIUM 75 MG PO TBEC: 75.0000 mg | DELAYED_RELEASE_TABLET | Freq: Two times a day (BID) | ORAL | 0 refills | Status: DC

## 2022-03-28 ENCOUNTER — Other Ambulatory Visit: Payer: Self-pay | Admitting: Family Medicine

## 2022-03-28 DIAGNOSIS — E038 Other specified hypothyroidism: Secondary | ICD-10-CM

## 2022-03-30 NOTE — Telephone Encounter (Signed)
Requested Prescriptions  Pending Prescriptions Disp Refills   levothyroxine (SYNTHROID) 25 MCG tablet [Pharmacy Med Name: LEVOTHYROXINE 25 MCG TABLET] 90 tablet 0    Sig: TAKE 1 TABLET DAILY BEFORE BREAKFAST (NEED APPOINTMENT/OFFICE VISIT BEFORE ANYMORE REFILLS)     Endocrinology:  Hypothyroid Agents Passed - 03/28/2022  8:39 AM      Passed - TSH in normal range and within 360 days    TSH  Date Value Ref Range Status  05/23/2021 2.30 0.40 - 4.50 mIU/L Final         Passed - Valid encounter within last 12 months    Recent Outpatient Visits           10 months ago General medical exam   [place] Susy Frizzle, MD   11 months ago Essential hypertension   [place], Cammie Mcgee, MD   1 year ago Dyspnea on exertion   [place] Susy Frizzle, MD   2 years ago Chronic pain of right knee   [place] [facility] Dennard Schaumann Cammie Mcgee, MD   3 years ago Breast cancer screening   [place] Pickard, Cammie Mcgee, MD

## 2022-05-18 ENCOUNTER — Other Ambulatory Visit: Payer: Self-pay | Admitting: Family Medicine

## 2022-05-18 DIAGNOSIS — R768 Other specified abnormal immunological findings in serum: Secondary | ICD-10-CM

## 2022-05-19 ENCOUNTER — Other Ambulatory Visit: Payer: Self-pay

## 2022-05-19 MED ORDER — DICLOFENAC SODIUM 75 MG PO TBEC
75.0000 mg | DELAYED_RELEASE_TABLET | Freq: Two times a day (BID) | ORAL | 0 refills | Status: DC
  Filled 2022-05-19: qty 60, 30d supply, fill #0
  Filled 2022-06-11: qty 60, 30d supply, fill #1
  Filled 2022-07-19: qty 30, 15d supply, fill #1

## 2022-05-21 ENCOUNTER — Telehealth: Payer: Self-pay

## 2022-05-21 NOTE — Telephone Encounter (Signed)
Pt's insurance will no longer cover Albuterol. Pt asks if there is an alternative. Thank you.

## 2022-05-27 ENCOUNTER — Telehealth: Payer: Self-pay

## 2022-05-27 NOTE — Telephone Encounter (Signed)
Pt called in stating that the pharmacy informed them that their inhalers (albuterol (VENTOLIN HFA) 108 (90) as pt's husband uses the same inhaler as well is no longer covered by pt's insurance. Pt would like to speak to pcp nurse to find out if there is another inhaler that they could both get called in to pharmacy that would be covered by their insurance. Please advise.  Cb#: [phone]

## 2022-05-28 ENCOUNTER — Other Ambulatory Visit: Payer: Self-pay

## 2022-05-28 DIAGNOSIS — R0609 Other forms of dyspnea: Secondary | ICD-10-CM

## 2022-05-28 MED ORDER — ALBUTEROL SULFATE HFA 108 (90 BASE) MCG/ACT IN AERS: 2.0000 | INHALATION_SPRAY | Freq: Four times a day (QID) | RESPIRATORY_TRACT | 1 refills | Status: DC | PRN

## 2022-06-11 DIAGNOSIS — I10 Essential (primary) hypertension: Secondary | ICD-10-CM | POA: Diagnosis not present

## 2022-06-11 DIAGNOSIS — K5900 Constipation, unspecified: Secondary | ICD-10-CM | POA: Diagnosis not present

## 2022-06-11 DIAGNOSIS — R635 Abnormal weight gain: Secondary | ICD-10-CM | POA: Diagnosis not present

## 2022-06-11 DIAGNOSIS — Z1211 Encounter for screening for malignant neoplasm of colon: Secondary | ICD-10-CM | POA: Diagnosis not present

## 2022-06-12 ENCOUNTER — Other Ambulatory Visit (HOSPITAL_COMMUNITY): Payer: Self-pay

## 2022-07-02 DIAGNOSIS — Z1212 Encounter for screening for malignant neoplasm of rectum: Secondary | ICD-10-CM | POA: Diagnosis not present

## 2022-07-02 DIAGNOSIS — Z1211 Encounter for screening for malignant neoplasm of colon: Secondary | ICD-10-CM | POA: Diagnosis not present

## 2022-07-05 ENCOUNTER — Other Ambulatory Visit: Payer: Self-pay | Admitting: Family Medicine

## 2022-07-05 DIAGNOSIS — E038 Other specified hypothyroidism: Secondary | ICD-10-CM

## 2022-07-07 NOTE — Telephone Encounter (Signed)
Requested medications are due for refill today.  yes  Requested medications are on the active medications list.  yes  Last refill. 03/30/2022 #90 0 rf  Future visit scheduled.   no  Notes to clinic.  Labs are expired.    Requested Prescriptions  Pending Prescriptions Disp Refills   levothyroxine (SYNTHROID) 25 MCG tablet [Pharmacy Med Name: LEVOTHYROXINE 25 MCG TABLET] 90 tablet 0    Sig: TAKE 1 TABLET DAILY BEFORE BREAKFAST (NEED APPOINTMENT/OFFICE VISIT BEFORE ANYMORE REFILLS)     Endocrinology:  Hypothyroid Agents Failed - 07/05/2022  9:03 AM      Failed - TSH in normal range and within 360 days    TSH  Date Value Ref Range Status  05/23/2021 2.30 0.40 - 4.50 mIU/L Final         Failed - Valid encounter within last 12 months    Recent Outpatient Visits           1 year ago General medical exam   [facility] Donita Brooks, MD   1 year ago Essential hypertension   [facility] Tanya Nones, Priscille Heidelberg, MD   1 year ago Dyspnea on exertion   [facility] Donita Brooks, MD   2 years ago Chronic pain of right knee   [facility] Tanya Nones Priscille Heidelberg, MD   4 years ago Breast cancer screening   [facility] Pickard, Priscille Heidelberg, MD

## 2022-07-18 LAB — COLOGUARD: Cologuard: NEGATIVE

## 2022-07-20 ENCOUNTER — Other Ambulatory Visit: Payer: Self-pay

## 2022-07-20 ENCOUNTER — Other Ambulatory Visit (HOSPITAL_COMMUNITY): Payer: Self-pay

## 2022-07-21 ENCOUNTER — Other Ambulatory Visit (HOSPITAL_COMMUNITY): Payer: Self-pay

## 2022-08-05 ENCOUNTER — Other Ambulatory Visit: Payer: Self-pay | Admitting: Family Medicine

## 2022-08-05 DIAGNOSIS — E038 Other specified hypothyroidism: Secondary | ICD-10-CM

## 2022-08-05 NOTE — Telephone Encounter (Signed)
Requested Prescriptions  Pending Prescriptions Disp Refills   levothyroxine (SYNTHROID) 25 MCG tablet [Pharmacy Med Name: LEVOTHYROXINE 25 MCG TABLET] 90 tablet 0    Sig: TAKE 1 TABLET DAILY BEFORE BREAKFAST (NEED APPOINTMENT/OFFICE VISIT BEFORE ANYMORE REFILLS)     Endocrinology:  Hypothyroid Agents Failed - 08/05/2022 12:06 AM      Failed - TSH in normal range and within 360 days    TSH  Date Value Ref Range Status  05/23/2021 2.30 0.40 - 4.50 mIU/L Final         Failed - Valid encounter within last 12 months    Recent Outpatient Visits           1 year ago General medical exam   [facility] Donita Brooks, MD   1 year ago Essential hypertension   [facility] Donita Brooks, MD   2 years ago Dyspnea on exertion   [facility] Donita Brooks, MD   2 years ago Chronic pain of right knee   [facility] Tanya Nones Priscille Heidelberg, MD   4 years ago Breast cancer screening   [facility] Pickard, Priscille Heidelberg, MD

## 2022-09-04 ENCOUNTER — Encounter: Payer: Self-pay | Admitting: Family Medicine

## 2022-09-28 IMAGING — MG DIGITAL SCREENING BILAT W/ TOMO W/ CAD
8 series · 8 of 24 positions shown · non-contrast
Comparison: Previous exam(s).

CLINICAL DATA: Screening.

EXAM:
DIGITAL SCREENING BILATERAL MAMMOGRAM WITH TOMO AND CAD

[R CC synth-2D]
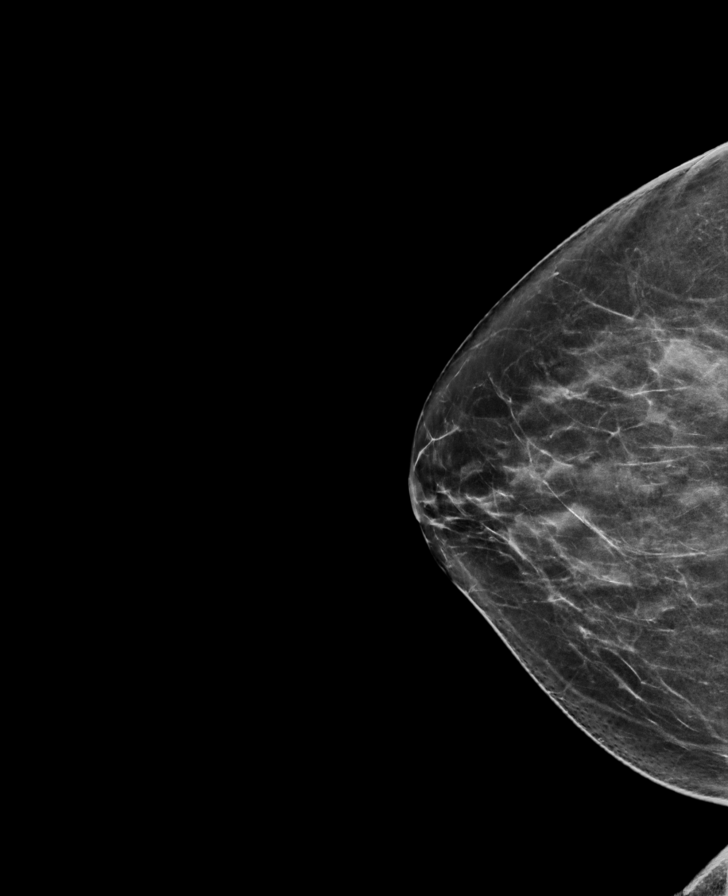

[R MLO synth-2D]
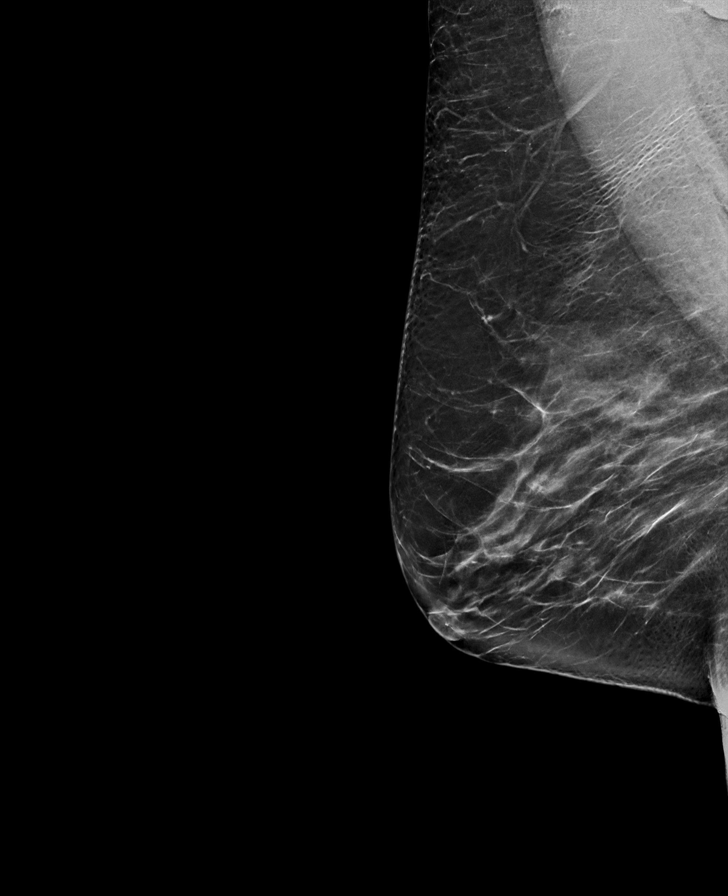

[L CC synth-2D]
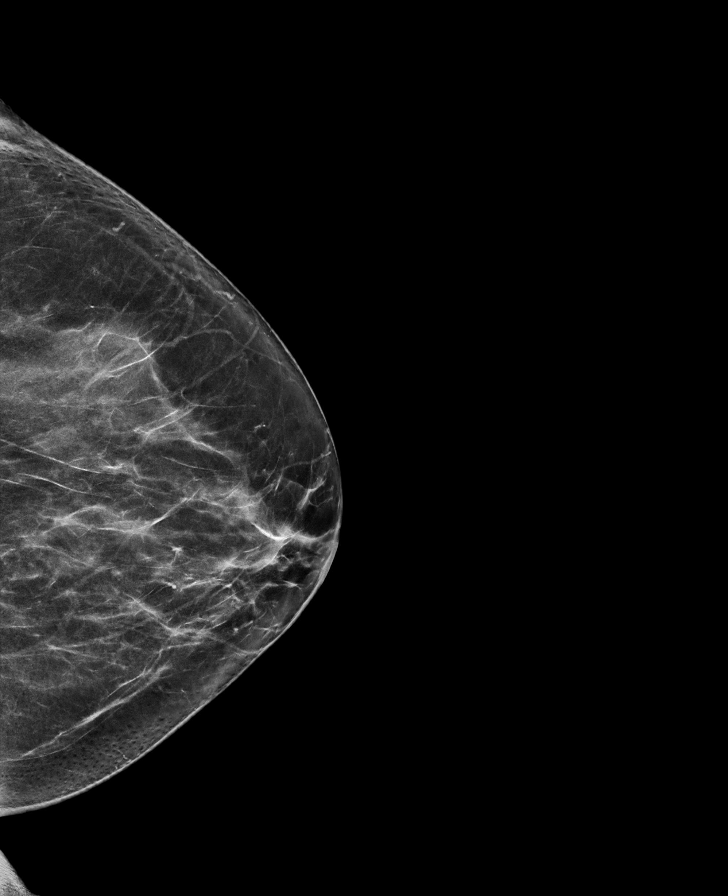

[L MLO synth-2D]
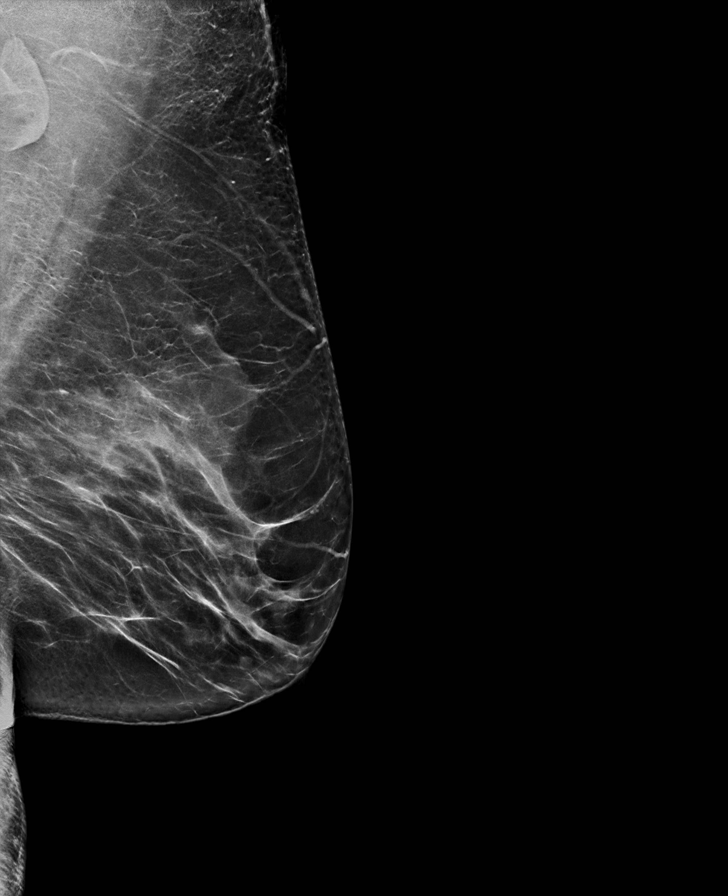

[L MLO tomo · tomo slice 39/76.0]
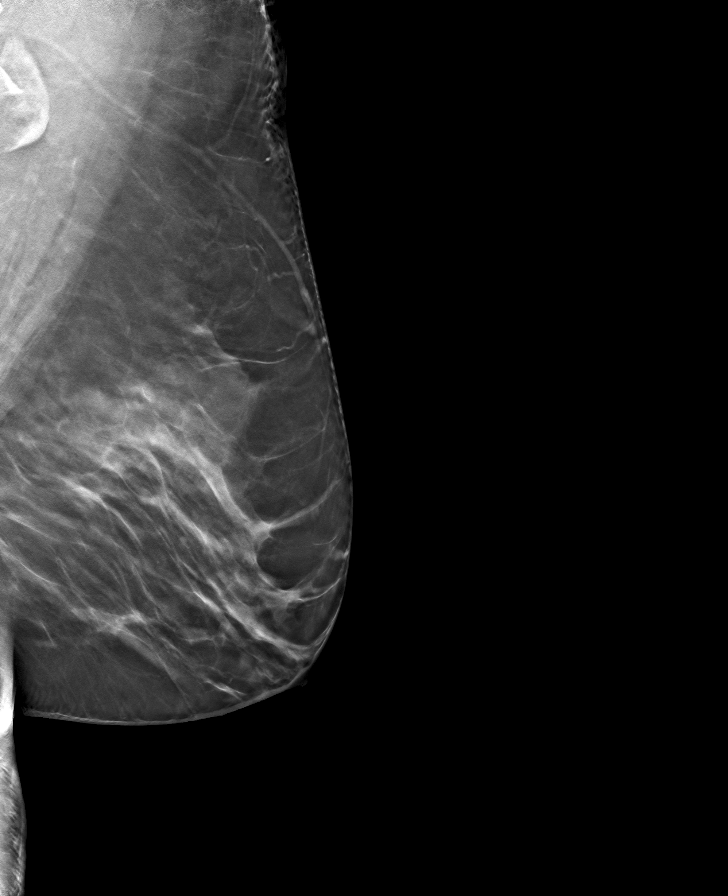

[R CC tomo · tomo slice 34/67.0]
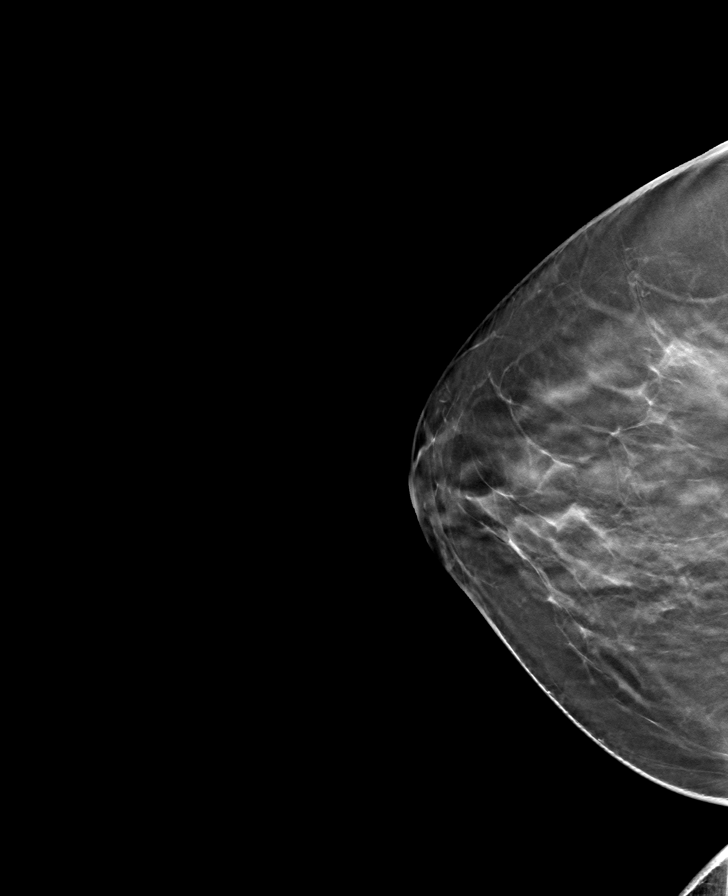

[R MLO tomo · tomo slice 39/77.0]
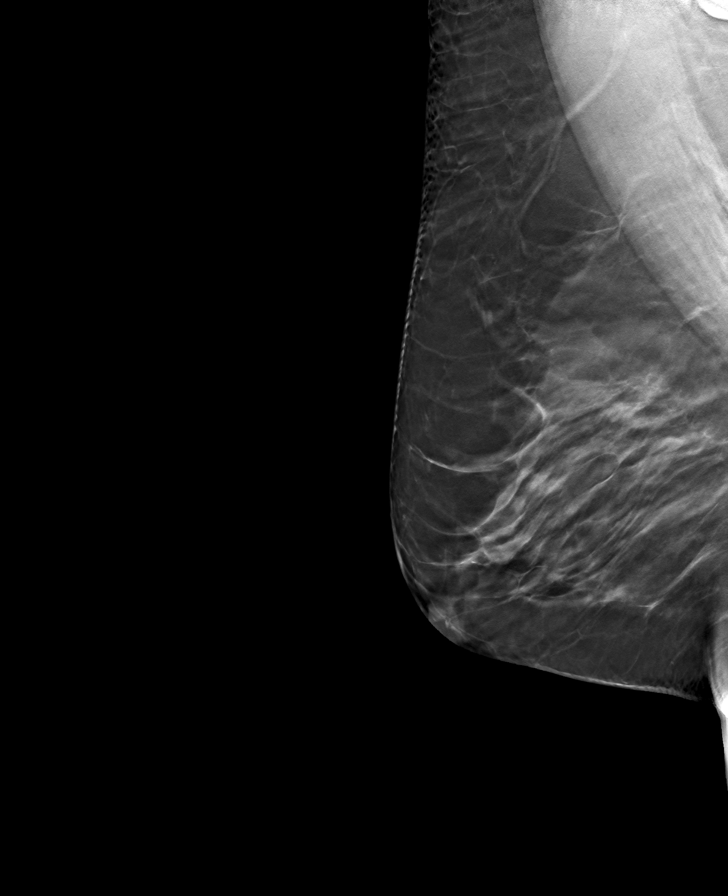

[L CC tomo · tomo slice 34/67.0]
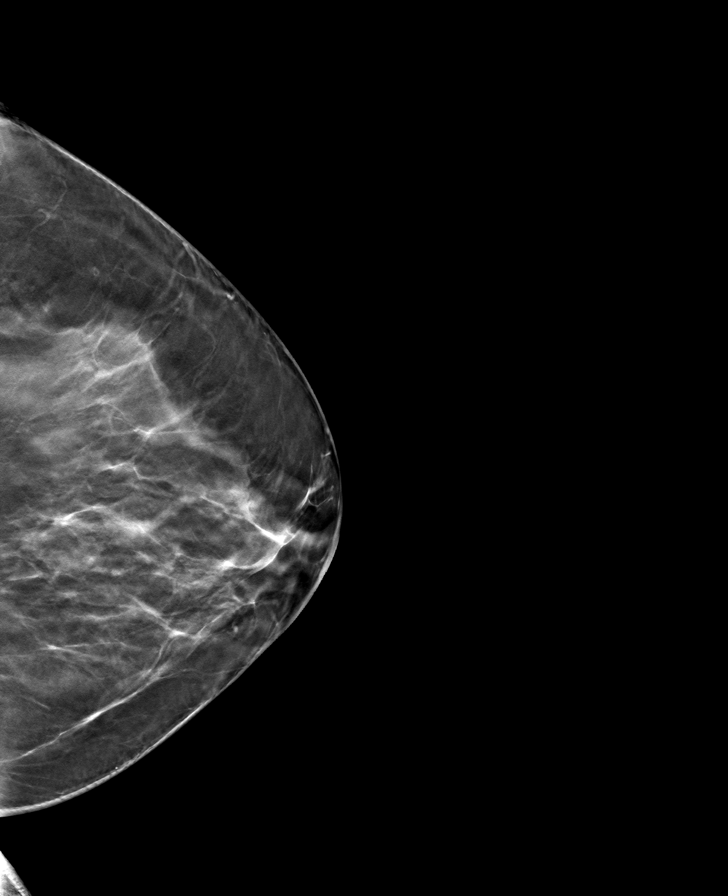

[8 of 24 positions shown; findings below may reference images not displayed]

ACR Breast Density Category c: The breast tissue is heterogeneously
dense, which may obscure small masses.
FINDINGS: There are no findings suspicious for malignancy. Images were
processed with CAD.
IMPRESSION: No mammographic evidence of malignancy. A result letter of this
screening mammogram will be mailed directly to the patient.

RECOMMENDATION:
Screening mammogram in one year. (Code:[id number])

BI-RADS CATEGORY  1: Negative.

## 2022-10-14 ENCOUNTER — Other Ambulatory Visit: Payer: Self-pay | Admitting: Family Medicine

## 2022-10-14 DIAGNOSIS — E038 Other specified hypothyroidism: Secondary | ICD-10-CM

## 2022-10-15 NOTE — Telephone Encounter (Signed)
Requested medication (s) are due for refill today - yes  Requested medication (s) are on the active medication list -yes  Future visit scheduled -no  Last refill: 07/07/22 #90  Notes to clinic: fails lab protocol- over 1 year-05/24/22, last OV 05/23/21  Requested Prescriptions  Pending Prescriptions Disp Refills   levothyroxine (SYNTHROID) 25 MCG tablet [Pharmacy Med Name: LEVOTHYROXINE 25 MCG TABLET] 90 tablet 0    Sig: TAKE 1 TABLET DAILY BEFORE BREAKFAST (NEED APPOINTMENT/OFFICE VISIT BEFORE ANYMORE REFILLS)     Endocrinology:  Hypothyroid Agents Failed - 10/14/2022  7:29 PM      Failed - TSH in normal range and within 360 days    TSH  Date Value Ref Range Status  05/23/2021 2.30 0.40 - 4.50 mIU/L Final         Failed - Valid encounter within last 12 months    Recent Outpatient Visits           1 year ago General medical exam   [facility] Donita Brooks, MD   1 year ago Essential hypertension   [facility] Tanya Nones, Priscille Heidelberg, MD   2 years ago Dyspnea on exertion   [facility] Donita Brooks, MD   2 years ago Chronic pain of right knee   [facility] Tanya Nones, Priscille Heidelberg, MD   4 years ago Breast cancer screening   [facility] Pickard, Priscille Heidelberg, MD                 Requested Prescriptions  Pending Prescriptions Disp Refills   levothyroxine (SYNTHROID) 25 MCG tablet [Pharmacy Med Name: LEVOTHYROXINE 25 MCG TABLET] 90 tablet 0    Sig: TAKE 1 TABLET DAILY BEFORE BREAKFAST (NEED APPOINTMENT/OFFICE VISIT BEFORE ANYMORE REFILLS)     Endocrinology:  Hypothyroid Agents Failed - 10/14/2022  7:29 PM      Failed - TSH in normal range and within 360 days    TSH  Date Value Ref Range Status  05/23/2021 2.30 0.40 - 4.50 mIU/L Final         Failed - Valid encounter within last 12 months    Recent Outpatient Visits           1 year ago General medical exam   [facility]  Donita Brooks, MD   1 year ago Essential hypertension   [facility] Donita Brooks, MD   2 years ago Dyspnea on exertion   [facility] Donita Brooks, MD   2 years ago Chronic pain of right knee   [facility] Tanya Nones Priscille Heidelberg, MD   4 years ago Breast cancer screening   [facility] Pickard, Priscille Heidelberg, MD

## 2022-10-19 NOTE — Telephone Encounter (Signed)
Pt called in to request a courtesy refill of this med levothyroxine (SYNTHROID) 25 MCG tablet [[id number]]. Pt will be out of this med before scheduled cpe on 12/10/22 with pcp. Please advise.  LOV: 05/23/21 UPCOMING CPE 12/10/22  PH[facility]Y: [id number] [place], [place] - [address]  [address] [place]    Cb#: [phone]

## 2022-10-19 NOTE — Telephone Encounter (Signed)
Requested medications are due for refill today.  yes  Requested medications are on the active medications list.  yes  Last refill. 07/07/2022 #90 0 rf  Future visit scheduled.   yes  Notes to clinic.  Labs are expired.    Requested Prescriptions  Pending Prescriptions Disp Refills   levothyroxine (SYNTHROID) 25 MCG tablet [Pharmacy Med Name: LEVOTHYROXINE 25 MCG TABLET] 90 tablet 0    Sig: TAKE 1 TABLET DAILY BEFORE BREAKFAST (NEED APPOINTMENT/OFFICE VISIT BEFORE ANYMORE REFILLS)     Endocrinology:  Hypothyroid Agents Failed - 10/19/2022 12:08 PM      Failed - TSH in normal range and within 360 days    TSH  Date Value Ref Range Status  05/23/2021 2.30 0.40 - 4.50 mIU/L Final         Failed - Valid encounter within last 12 months    Recent Outpatient Visits           1 year ago General medical exam   [facility] Donita Brooks, MD   1 year ago Essential hypertension   [facility] Donita Brooks, MD   2 years ago Dyspnea on exertion   [facility] Donita Brooks, MD   2 years ago Chronic pain of right knee   [facility] Tanya Nones Priscille Heidelberg, MD   4 years ago Breast cancer screening   [facility] Pickard, Priscille Heidelberg, MD       Future Appointments             In 1 month Pickard, Priscille Heidelberg, MD [facility]

## 2022-11-11 ENCOUNTER — Other Ambulatory Visit: Payer: Self-pay | Admitting: Family Medicine

## 2022-11-11 DIAGNOSIS — R0609 Other forms of dyspnea: Secondary | ICD-10-CM

## 2022-11-20 ENCOUNTER — Other Ambulatory Visit: Payer: BC Managed Care – PPO

## 2022-11-20 DIAGNOSIS — E038 Other specified hypothyroidism: Secondary | ICD-10-CM

## 2022-11-20 DIAGNOSIS — E66813 Obesity, class 3: Secondary | ICD-10-CM

## 2022-11-20 DIAGNOSIS — I10 Essential (primary) hypertension: Secondary | ICD-10-CM

## 2022-11-20 DIAGNOSIS — Z13220 Encounter for screening for lipoid disorders: Secondary | ICD-10-CM

## 2022-11-21 LAB — TSH: TSH: 5.65 m[IU]/L — ABNORMAL HIGH (ref 0.40–4.50)

## 2022-11-21 LAB — CBC WITH DIFFERENTIAL/PLATELET
Absolute Monocytes: 626 {cells}/uL (ref 200–950)
Basophils Absolute: 50 {cells}/uL (ref 0–200)
Basophils Relative: 0.7 %
Eosinophils Absolute: 158 {cells}/uL (ref 15–500)
Eosinophils Relative: 2.2 %
HCT: 43.2 % (ref 35.0–45.0)
Hemoglobin: 14.3 g/dL (ref 11.7–15.5)
Lymphs Abs: 2318 {cells}/uL (ref 850–3900)
MCH: 28.9 pg (ref 27.0–33.0)
MCHC: 33.1 g/dL (ref 32.0–36.0)
MCV: 87.4 fL (ref 80.0–100.0)
MPV: 10.5 fL (ref 7.5–12.5)
Monocytes Relative: 8.7 %
Neutro Abs: 4046 {cells}/uL (ref 1500–7800)
Neutrophils Relative %: 56.2 %
Platelets: 285 10*3/uL (ref 140–400)
RBC: 4.94 10*6/uL (ref 3.80–5.10)
RDW: 13.3 % (ref 11.0–15.0)
Total Lymphocyte: 32.2 %
WBC: 7.2 10*3/uL (ref 3.8–10.8)

## 2022-11-21 LAB — COMPLETE METABOLIC PANEL WITH GFR
AG Ratio: 1.2 (calc) (ref 1.0–2.5)
ALT: 20 U/L (ref 6–29)
AST: 16 U/L (ref 10–35)
Albumin: 4.1 g/dL (ref 3.6–5.1)
Alkaline phosphatase (APISO): 79 U/L (ref 37–153)
BUN: 13 mg/dL (ref 7–25)
CO2: 24 mmol/L (ref 20–32)
Calcium: 9.6 mg/dL (ref 8.6–10.4)
Chloride: 103 mmol/L (ref 98–110)
Creat: 0.87 mg/dL (ref 0.50–1.05)
Globulin: 3.5 g/dL (ref 1.9–3.7)
Glucose, Bld: 96 mg/dL (ref 65–99)
Potassium: 4.1 mmol/L (ref 3.5–5.3)
Sodium: 138 mmol/L (ref 135–146)
Total Bilirubin: 0.6 mg/dL (ref 0.2–1.2)
Total Protein: 7.6 g/dL (ref 6.1–8.1)
eGFR: 76 mL/min/{1.73_m2} (ref >=60)

## 2022-11-21 LAB — LIPID PANEL
Cholesterol: 190 mg/dL (ref <200)
HDL: 49 mg/dL — ABNORMAL LOW (ref >=50)
LDL Cholesterol (Calc): 119 mg/dL — ABNORMAL HIGH
Non-HDL Cholesterol (Calc): 141 mg/dL — ABNORMAL HIGH (ref <130)
Total CHOL/HDL Ratio: 3.9 (calc) (ref <5.0)
Triglycerides: 108 mg/dL (ref <150)

## 2022-12-10 ENCOUNTER — Ambulatory Visit: Payer: BC Managed Care – PPO | Admitting: Family Medicine

## 2022-12-10 ENCOUNTER — Encounter: Payer: Self-pay | Admitting: Family Medicine

## 2022-12-10 VITALS — BP 164/102 | HR 85 | Temp 98.3°F | Ht 67.0 in | Wt 304.2 lb

## 2022-12-10 DIAGNOSIS — E66813 Obesity, class 3: Secondary | ICD-10-CM | POA: Diagnosis not present

## 2022-12-10 DIAGNOSIS — Z1211 Encounter for screening for malignant neoplasm of colon: Secondary | ICD-10-CM | POA: Insufficient documentation

## 2022-12-10 DIAGNOSIS — Z0000 Encounter for general adult medical examination without abnormal findings: Secondary | ICD-10-CM

## 2022-12-10 DIAGNOSIS — K5900 Constipation, unspecified: Secondary | ICD-10-CM | POA: Insufficient documentation

## 2022-12-10 DIAGNOSIS — Z1231 Encounter for screening mammogram for malignant neoplasm of breast: Secondary | ICD-10-CM

## 2022-12-10 DIAGNOSIS — Z0001 Encounter for general adult medical examination with abnormal findings: Secondary | ICD-10-CM

## 2022-12-10 DIAGNOSIS — Z23 Encounter for immunization: Secondary | ICD-10-CM

## 2022-12-10 DIAGNOSIS — I10 Essential (primary) hypertension: Secondary | ICD-10-CM

## 2022-12-10 DIAGNOSIS — Z6841 Body Mass Index (BMI) 40.0 and over, adult: Secondary | ICD-10-CM

## 2022-12-10 DIAGNOSIS — R635 Abnormal weight gain: Secondary | ICD-10-CM | POA: Insufficient documentation

## 2022-12-10 MED ORDER — LEVOTHYROXINE SODIUM 50 MCG PO TABS
50.0000 ug | ORAL_TABLET | Freq: Every day | ORAL | 3 refills | Status: DC
Start: 2022-12-10 — End: 2023-01-20

## 2022-12-10 MED ORDER — MELOXICAM 15 MG PO TABS: 15.0000 mg | ORAL_TABLET | Freq: Every day | ORAL | 0 refills | Status: DC

## 2022-12-10 NOTE — Progress Notes (Signed)
Subjective:    Patient ID: Diana Hamilton, female    DOB: 03-24-1960, 62 y.o.   MRN: [id number]  HPI Patient is a very sweet 62 year old Caucasian female here today for complete physical exam.  She is very tearful today.  Her blood pressure is extremely high.  She is also upset about her weight.  She had a Cologuard performed earlier this year which was negative.  She is due for mammogram.  She is due for the flu shot and a shingles vaccine.  She also discussed weight loss and she also complains about bilateral knee pain.  She also reports bloating and indigestion in the upper epigastric area on a daily basis  Immunization History  Administered Date(s) Administered   Hepatitis A 07/03/2005   Influenza,inj,Quad PF,6+ Mos 04/11/2015, 04/09/2016, 04/22/2018   Tdap 08/08/2010, 04/13/2016   Typhoid Inactivated 07/03/2005    Past Medical History:  Diagnosis Date   Chronic venous insufficiency    DDD (degenerative disc disease)    Hyperlipidemia    Hypertension    Peripheral neuropathy    Positive ANA (antinuclear antibody)    Thyroid disease    Past Surgical History:  Procedure Laterality Date   ABDOMINAL HYSTERECTOMY  01/2006   fibroids   Current Outpatient Medications on File Prior to Visit  Medication Sig Dispense Refill   albuterol (VENTOLIN HFA) 108 (90 Base) MCG/ACT inhaler TAKE 2 PUFFS BY MOUTH EVERY 6 HOURS AS NEEDED FOR WHEEZE OR SHORTNESS OF BREATH 6.7 each 1   amLODipine (NORVASC) 10 MG tablet Take 10 mg by mouth daily. Pt forgets to take it.     hydrochlorothiazide (HYDRODIURIL) 25 MG tablet Take 1 tablet (25 mg total) by mouth daily. 90 tablet 3   levothyroxine (SYNTHROID) 25 MCG tablet TAKE 1 TABLET DAILY BEFORE BREAKFAST (NEED APPOINTMENT/OFFICE VISIT BEFORE ANYMORE REFILLS) 90 tablet 0   losartan (COZAAR) 100 MG tablet TAKE 1 TABLET DAILY 90 tablet 3   No current facility-administered medications on file prior to visit.     Allergies  Allergen Reactions    Lisinopril Cough   Sulfa Antibiotics    Social History   Socioeconomic History   Marital status: Married    Spouse name: Not on file   Number of children: 2   Years of education: Not on file   Highest education level: 12th grade  Occupational History   Occupation: textile  Tobacco Use   Smoking status: Never   Smokeless tobacco: Never  Substance and Sexual Activity   Alcohol use: No   Drug use: No   Sexual activity: Not on file  Other Topics Concern   Not on file  Social History Narrative   Not on file   Social Determinants of Health   Financial Resource Strain: Low Risk  (12/04/2022)   Overall Financial Resource Strain (CARDIA)    Difficulty of Paying Living Expenses: Not very hard  Food Insecurity: No Food Insecurity (12/04/2022)   Hunger Vital Sign    Worried About Running Out of Food in the Last Year: Never true    Ran Out of Food in the Last Year: Never true  Transportation Needs: No Transportation Needs (12/04/2022)   PRAPARE - Administrator, Civil Service (Medical): No    Lack of Transportation (Non-Medical): No  Physical Activity: Insufficiently Active (12/04/2022)   Exercise Vital Sign    Days of Exercise per Week: 1 day    Minutes of Exercise per Session: 20 min  Stress: No Stress  Concern Present (12/04/2022)   [employer] of Occupational Health - Occupational Stress Questionnaire    Feeling of Stress : Not at all  Social Connections: Moderately Integrated (12/04/2022)   Social Connection and Isolation Panel [NHANES]    Frequency of Communication with Friends and Family: Once a week    Frequency of Social Gatherings with Friends and Family: Twice a week    Attends Religious Services: 1 to 4 times per year    Active Member of [employer] or Organizations: No    Attends Engineer, structural: Not on file    Marital Status: Married  Catering manager Violence: Not on file   Family History  Problem Relation Age of Onset   Heart disease  Mother        arrhythmia   Cancer Mother 63       breast   Breast cancer Mother    Hypertension Father    Stroke Father    Breast cancer Sister    Cancer Sister 20       breast   Breast cancer Maternal Aunt    Breast cancer Paternal Aunt    Breast cancer Paternal Aunt    Breast cancer Paternal Aunt      Review of Systems  All other systems reviewed and are negative.      Objective:   Physical Exam Vitals reviewed.  Constitutional:      General: She is not in acute distress.    Appearance: She is well-developed. She is not diaphoretic.  HENT:     Head: Normocephalic and atraumatic.     Right Ear: External ear normal.     Left Ear: External ear normal.     Nose: Nose normal.     Mouth/Throat:     Pharynx: No oropharyngeal exudate.  Eyes:     General: No scleral icterus.       Right eye: No discharge.        Left eye: No discharge.     Conjunctiva/sclera: Conjunctivae normal.     Pupils: Pupils are equal, round, and reactive to light.  Neck:     Thyroid: No thyromegaly.     Vascular: No JVD.     Trachea: No tracheal deviation.  Cardiovascular:     Rate and Rhythm: Normal rate and regular rhythm.     Heart sounds: Normal heart sounds. No murmur heard.    No friction rub.  Pulmonary:     Effort: Pulmonary effort is normal. No respiratory distress.     Breath sounds: Normal breath sounds. No stridor. No wheezing or rales.  Chest:     Chest wall: No tenderness.  Abdominal:     General: Bowel sounds are normal. There is no distension.     Palpations: Abdomen is soft. There is no mass.     Tenderness: There is no abdominal tenderness. There is no guarding or rebound.  Musculoskeletal:        General: No tenderness. Normal range of motion.     Cervical back: Normal range of motion and neck supple.  Lymphadenopathy:     Cervical: No cervical adenopathy.  Skin:    General: Skin is warm.     Coloration: Skin is not pale.     Findings: No erythema or rash.   Neurological:     Mental Status: She is alert and oriented to person, place, and time.     Cranial Nerves: No cranial nerve deficit.     Motor: No abnormal  muscle tone.     Coordination: Coordination normal.     Deep Tendon Reflexes: Reflexes are normal and symmetric.  Psychiatric:        Behavior: Behavior normal.        Thought Content: Thought content normal.        Judgment: Judgment normal.           Assessment & Plan:  Encounter for screening mammogram for malignant neoplasm of breast - Plan: MM Digital Screening  Class 3 severe obesity with serious comorbidity and body mass index (BMI) of 40.0 to 44.9 in adult, unspecified obesity type (HCC)  General medical exam  Essential hypertension Blood pressure is extremely high.  Resume amlodipine 10 mg a day and recheck blood pressure in 2 weeks.  Patient received her flu shot and the shingles shot today.  Schedule the patient for mammogram.  Cologuard is up-to-date.  We discussed semaglutide for weight loss and I gave her contact information with local compounding pharmacies.  I reviewed the remaining lab work with the patient which was within normal limits aside from mild elevation in her cholesterol.  I believe that weight loss would be the biggest benefit for her at this point.  We are going to start omeprazole 20 mg a day for bloating and indigestion and see if her symptoms improve.  If so we may want to consider starting her on meloxicam for the knee pain with

## 2022-12-10 NOTE — Addendum Note (Signed)
Addended by: Venia Carbon K on: 12/10/2022 12:07 PM   Modules accepted: Orders

## 2022-12-14 ENCOUNTER — Telehealth: Payer: Self-pay

## 2022-12-14 NOTE — Telephone Encounter (Signed)
Pt called in to give bp reading to nurse/pcp. Pt stated that at next appt she will bring in her bp machine to compare. Please advise.  BP 12/14/22 140/73  Cb#: [phone]

## 2022-12-25 ENCOUNTER — Ambulatory Visit: Payer: 59

## 2022-12-25 VITALS — BP 142/86 | HR 99

## 2022-12-25 DIAGNOSIS — I10 Essential (primary) hypertension: Secondary | ICD-10-CM

## 2022-12-25 NOTE — Progress Notes (Signed)
Patient is in office today for a nurse visit for Blood Pressure Check. Patient blood pressure was 142/86, Patient is doing well. No issues.

## 2023-01-10 ENCOUNTER — Other Ambulatory Visit: Payer: Self-pay | Admitting: Family Medicine

## 2023-01-13 ENCOUNTER — Telehealth: Payer: Self-pay | Admitting: Family Medicine

## 2023-01-13 ENCOUNTER — Other Ambulatory Visit: Payer: Self-pay

## 2023-01-13 DIAGNOSIS — R0609 Other forms of dyspnea: Secondary | ICD-10-CM

## 2023-01-13 MED ORDER — ALBUTEROL SULFATE HFA 108 (90 BASE) MCG/ACT IN AERS: 2.0000 | INHALATION_SPRAY | Freq: Four times a day (QID) | RESPIRATORY_TRACT | 1 refills | Status: DC | PRN

## 2023-01-13 NOTE — Telephone Encounter (Signed)
OV 12/10/22 Requested Prescriptions  Pending Prescriptions Disp Refills   meloxicam (MOBIC) 15 MG tablet [Pharmacy Med Name: MELOXICAM 15 MG TABLET] 30 tablet 0    Sig: TAKE 1 TABLET (15 MG TOTAL) BY MOUTH DAILY.     Analgesics:  COX2 Inhibitors Failed - 01/10/2023  8:56 AM      Failed - Manual Review: Labs are only required if the patient has taken medication for more than 8 weeks.      Failed - Valid encounter within last 12 months    Recent Outpatient Visits           1 year ago General medical exam   [facility] Donita Brooks, MD   1 year ago Essential hypertension   [facility] Tanya Nones, Priscille Heidelberg, MD   2 years ago Dyspnea on exertion   [facility] Donita Brooks, MD   3 years ago Chronic pain of right knee   [facility] Donita Brooks, MD   4 years ago Breast cancer screening   [facility] Pickard, Priscille Heidelberg, MD              Passed - HGB in normal range and within 360 days    Hemoglobin  Date Value Ref Range Status  11/20/2022 14.3 11.7 - 15.5 g/dL Final         Passed - Cr in normal range and within 360 days    Creat  Date Value Ref Range Status  11/20/2022 0.87 0.50 - 1.05 mg/dL Final         Passed - HCT in normal range and within 360 days    HCT  Date Value Ref Range Status  11/20/2022 43.2 35.0 - 45.0 % Final         Passed - AST in normal range and within 360 days    AST  Date Value Ref Range Status  11/20/2022 16 10 - 35 U/L Final         Passed - ALT in normal range and within 360 days    ALT  Date Value Ref Range Status  11/20/2022 20 6 - 29 U/L Final         Passed - eGFR is 30 or above and within 360 days    GFR, Est African American  Date Value Ref Range Status  01/08/2020 99 > OR = 60 mL/min/1.22m2 Final   GFR, Est Non African American  Date Value Ref Range Status  01/08/2020 86 > OR = 60 mL/min/1.67m2 Final   GFR, Estimated  Date Value  Ref Range Status  07/25/2020 >60 >60 mL/min Final    Comment:    (NOTE) Calculated using the CKD-EPI Creatinine Equation (2021)    eGFR  Date Value Ref Range Status  11/20/2022 76 > OR = 60 mL/min/1.45m2 Final         Passed - Patient is not pregnant

## 2023-01-13 NOTE — Telephone Encounter (Signed)
Prescription Request  01/13/2023  LOV: 12/10/2022  What is the name of the medication or equipment? albuterol (VENTOLIN HFA) 108 (90 Base) MCG/ACT inhale   Have you contacted your pharmacy to request a refill? Yes   Which pharmacy would you like this sent to?  [id number] [place], [place] - [address] [place] [address] Phone: [phone] Fax: [phone]    Patient notified that their request is being sent to the clinical staff for review and that they should receive a response within 2 business days.   Please advise at [facility] [phone]

## 2023-01-20 ENCOUNTER — Telehealth: Payer: Self-pay

## 2023-01-20 ENCOUNTER — Other Ambulatory Visit: Payer: Self-pay

## 2023-01-20 DIAGNOSIS — E038 Other specified hypothyroidism: Secondary | ICD-10-CM

## 2023-01-20 MED ORDER — LEVOTHYROXINE SODIUM 50 MCG PO TABS: 50.0000 ug | ORAL_TABLET | Freq: Every day | ORAL | 3 refills | Status: DC

## 2023-01-20 NOTE — Telephone Encounter (Signed)
Prescription Request  01/20/2023  LOV: 12/10/22  What is the name of the medication or equipment? levothyroxine (SYNTHROID) 50 MCG tablet [[id number]]  Have you contacted your pharmacy to request a refill? Yes   Which pharmacy would you like this sent to?  [id number] [place], [place] - [address] [place] [address] Phone: [phone] Fax: [phone]    Patient notified that their request is being sent to the clinical staff for review and that they should receive a response within 2 business days.   Please advise at [facility] [phone]

## 2023-02-04 ENCOUNTER — Other Ambulatory Visit: Payer: 59

## 2023-02-04 ENCOUNTER — Telehealth: Payer: Self-pay

## 2023-02-04 ENCOUNTER — Other Ambulatory Visit: Payer: Self-pay | Admitting: Family Medicine

## 2023-02-04 DIAGNOSIS — E66813 Obesity, class 3: Secondary | ICD-10-CM

## 2023-02-04 DIAGNOSIS — I10 Essential (primary) hypertension: Secondary | ICD-10-CM

## 2023-02-04 DIAGNOSIS — R768 Other specified abnormal immunological findings in serum: Secondary | ICD-10-CM

## 2023-02-04 DIAGNOSIS — E038 Other specified hypothyroidism: Secondary | ICD-10-CM

## 2023-02-04 MED ORDER — LOSARTAN POTASSIUM 100 MG PO TABS: 100.0000 mg | ORAL_TABLET | Freq: Every day | ORAL | 3 refills | Status: DC

## 2023-02-04 MED ORDER — AZITHROMYCIN 250 MG PO TABS: ORAL_TABLET | ORAL | 0 refills | Status: DC

## 2023-02-04 NOTE — Telephone Encounter (Signed)
Copied from CRM [id number]. Topic: Clinical - Medication Refill >> Feb 04, 2023 11:30 AM Dimitri Ped wrote: Most Recent Primary Care Visit:  Provider: Venia Carbon K  Department: [facility]  Visit Type: NURSE VISIT  Date: 12/25/2022  Medication: ***  Has the patient contacted their pharmacy?  (Agent: If no, request that the patient contact the pharmacy for the refill. If patient does not wish to contact the pharmacy document the reason why and proceed with request.) (Agent: If yes, when and what did the pharmacy advise?)  Is this the correct pharmacy for this prescription?  If no, delete pharmacy and type the correct one.  This is the patient's preferred pharmacy:  [id number] [place], [place] - [address] [place] [address] Phone: [phone] Fax: [phone]   Has the prescription been filled recently?   Is the patient out of the medication?   Has the patient been seen for an appointment in the last year OR does the patient have an upcoming appointment?   Can we respond through MyChart?   Agent: Please be advised that Rx refills may take up to 3 business days. We ask that you follow-up with your pharmacy.

## 2023-02-04 NOTE — Telephone Encounter (Signed)
Pt here for labs. Pt c/o sinus pain and pressure for several days. Pt states she has used Mucinex, Tylenol Cold without relief. Pt states she is now having chest congestion. Pt asks if an abx can be sent in for her? Thank you.

## 2023-02-04 NOTE — Progress Notes (Signed)
Zpack

## 2023-02-04 NOTE — Telephone Encounter (Signed)
Copied from CRM [id number]. Topic: Clinical - Prescription Issue >> Feb 04, 2023 11:34 AM Dimitri Ped wrote: Reason for CRM: patient is calling concerning blood pressure medication . I sent a refill crm for losartan that patient is saying needs to be automatic .refills and this medication need to be automatic refill too evothyroxine (SYNTHROID) 50 MCG tablet

## 2023-02-05 LAB — TSH: TSH: 2.66 m[IU]/L (ref 0.40–4.50)

## 2023-02-11 ENCOUNTER — Telehealth: Payer: Self-pay | Admitting: Family Medicine

## 2023-02-11 ENCOUNTER — Other Ambulatory Visit: Payer: Self-pay

## 2023-02-11 MED ORDER — MELOXICAM 15 MG PO TABS: 15.0000 mg | ORAL_TABLET | Freq: Every day | ORAL | 0 refills | Status: DC

## 2023-02-11 NOTE — Telephone Encounter (Signed)
 Prescription Request  02/11/2023  LOV: 12/10/2022  What is the name of the medication or equipment? meloxicam  (MOBIC ) 15 MG tablet   Have you contacted your pharmacy to request a refill? Yes   Which pharmacy would you like this sent to?  [id number] [place], [place] - [address] [place] [address] Phone: [phone] Fax: [phone]    Patient notified that their request is being sent to the clinical staff for review and that they should receive a response within 2 business days.   Please advise at [facility] [phone]

## 2023-02-26 ENCOUNTER — Ambulatory Visit (INDEPENDENT_AMBULATORY_CARE_PROVIDER_SITE_OTHER): Payer: BC Managed Care – PPO

## 2023-02-26 DIAGNOSIS — Z23 Encounter for immunization: Secondary | ICD-10-CM | POA: Diagnosis not present

## 2023-02-26 NOTE — Progress Notes (Signed)
Pt is here for shingles  vaccine. I informed pt of possible side effects. Pt tolerated the injection well. I adivsed her to call the office with any concerns.   Vella Kohler, CMA

## 2023-03-12 ENCOUNTER — Other Ambulatory Visit: Payer: Self-pay | Admitting: Family Medicine

## 2023-03-19 ENCOUNTER — Other Ambulatory Visit: Payer: Self-pay | Admitting: Family Medicine

## 2023-03-19 DIAGNOSIS — R0609 Other forms of dyspnea: Secondary | ICD-10-CM

## 2023-03-19 MED ORDER — ALBUTEROL SULFATE HFA 108 (90 BASE) MCG/ACT IN AERS: 2.0000 | INHALATION_SPRAY | Freq: Four times a day (QID) | RESPIRATORY_TRACT | 1 refills | Status: DC | PRN

## 2023-03-19 NOTE — Telephone Encounter (Signed)
 Copied from CRM [id number]. Topic: Clinical - Medication Refill >> Mar 19, 2023 12:50 PM Cherylann RAMAN wrote: Most Recent Primary Care Visit:  Provider: ERIKA ELIDA RAMAN  Department: [facility]  Visit Type: CLINICAL SUPPORT  Date: 02/26/2023  Medication: albuterol  (VENTOLIN  HFA) 108 (90 Base) MCG/ACT inhaler  Has the patient contacted their pharmacy? No (Agent: If no, request that the patient contact the pharmacy for the refill. If patient does not wish to contact the pharmacy document the reason why and proceed with request.) (Agent: If yes, when and what did the pharmacy advise?)  Is this the correct pharmacy for this prescription? Yes If no, delete pharmacy and type the correct one.  This is the patient's preferred pharmacy:  [id number] [place], [place] - [address] [place] [address] Phone: [phone] Fax: [phone]   Has the prescription been filled recently? No  Is the patient out of the medication? Yes  Has the patient been seen for an appointment in the last year OR does the patient have an upcoming appointment? Yes  Can we respond through MyChart? Yes  Agent: Please be advised that Rx refills may take up to 3 business days. We ask that you follow-up with your pharmacy.

## 2023-03-19 NOTE — Telephone Encounter (Signed)
 Copied from CRM [id number]. Topic: Clinical - Medication Refill >> Mar 19, 2023 12:48 PM Cherylann RAMAN wrote: Most Recent Primary Care Visit:  Provider: ERIKA ELIDA RAMAN  Department: [facility]  Visit Type: CLINICAL SUPPORT  Date: 02/26/2023  Medication: albuterol  (VENTOLIN  HFA) 108 (90 Base) MCG/ACT inhaler  Has the patient contacted their pharmacy? No (Agent: If no, request that the patient contact the pharmacy for the refill. If patient does not wish to contact the pharmacy document the reason why and proceed with request.) (Agent: If yes, when and what did the pharmacy advise?)  Is this the correct pharmacy for this prescription? Yes If no, delete pharmacy and type the correct one.  This is the patient's preferred pharmacy:  [id number] [place],  - [address] [place] [address] Phone: [phone] Fax: [phone]   Has the prescription been filled recently? No  Is the patient out of the medication? No  Has the patient been seen for an appointment in the last year OR does the patient have an upcoming appointment? Yes  Can we respond through MyChart? Yes  Agent: Please be advised that Rx refills may take up to 3 business days. We ask that you follow-up with your pharmacy.

## 2023-04-12 ENCOUNTER — Telehealth: Payer: Self-pay | Admitting: Family Medicine

## 2023-04-12 MED ORDER — MELOXICAM 15 MG PO TABS: 15.0000 mg | ORAL_TABLET | Freq: Every day | ORAL | 0 refills | Status: DC

## 2023-04-12 NOTE — Telephone Encounter (Signed)
 Prescription Request  04/12/2023  LOV: 12/10/2022  What is the name of the medication or equipment?   meloxicam (MOBIC) 15 MG tablet   Have you contacted your pharmacy to request a refill? Yes   Which pharmacy would you like this sent to?  [id number] [place], [place] - [address] [facility] [address] [place] [address] Phone: [phone] Fax: [phone]    Patient notified that their request is being sent to the clinical staff for review and that they should receive a response within 2 business days.   Please advise pharmacist.

## 2023-04-27 ENCOUNTER — Other Ambulatory Visit: Payer: Self-pay

## 2023-05-07 ENCOUNTER — Other Ambulatory Visit (HOSPITAL_COMMUNITY): Payer: Self-pay

## 2023-05-16 ENCOUNTER — Other Ambulatory Visit: Payer: Self-pay | Admitting: Family Medicine

## 2023-05-18 NOTE — Telephone Encounter (Signed)
 Requested Prescriptions  Pending Prescriptions Disp Refills   meloxicam (MOBIC) 15 MG tablet [Pharmacy Med Name: MELOXICAM 15 MG TABLET] 30 tablet 0    Sig: TAKE 1 TABLET (15 MG TOTAL) BY MOUTH DAILY.     Analgesics:  COX2 Inhibitors Failed - 05/18/2023  8:14 AM      Failed - Manual Review: Labs are only required if the patient has taken medication for more than 8 weeks.      Passed - HGB in normal range and within 360 days    Hemoglobin  Date Value Ref Range Status  11/20/2022 14.3 11.7 - 15.5 g/dL Final         Passed - Cr in normal range and within 360 days    Creat  Date Value Ref Range Status  11/20/2022 0.87 0.50 - 1.05 mg/dL Final         Passed - HCT in normal range and within 360 days    HCT  Date Value Ref Range Status  11/20/2022 43.2 35.0 - 45.0 % Final         Passed - AST in normal range and within 360 days    AST  Date Value Ref Range Status  11/20/2022 16 10 - 35 U/L Final         Passed - ALT in normal range and within 360 days    ALT  Date Value Ref Range Status  11/20/2022 20 6 - 29 U/L Final         Passed - eGFR is 30 or above and within 360 days    GFR, Est African American  Date Value Ref Range Status  01/08/2020 99 > OR = 60 mL/min/1.58m2 Final   GFR, Est Non African American  Date Value Ref Range Status  01/08/2020 86 > OR = 60 mL/min/1.12m2 Final   GFR, Estimated  Date Value Ref Range Status  07/25/2020 >60 >60 mL/min Final    Comment:    (NOTE) Calculated using the CKD-EPI Creatinine Equation (2021)    eGFR  Date Value Ref Range Status  11/20/2022 76 > OR = 60 mL/min/1.93m2 Final         Passed - Patient is not pregnant      Passed - Valid encounter within last 12 months    Recent Outpatient Visits           5 months ago Encounter for screening mammogram for malignant neoplasm of breast    [facility] Pickard, Priscille Heidelberg, MD

## 2023-06-24 ENCOUNTER — Other Ambulatory Visit: Payer: Self-pay | Admitting: Family Medicine

## 2023-06-24 NOTE — Telephone Encounter (Signed)
 Prescription Request  06/24/2023  LOV: 12/10/2022  What is the name of the medication or equipment? meloxicam  (MOBIC ) 15 MG tablet   Have you contacted your pharmacy to request a refill? Yes   Which pharmacy would you like this sent to?  [id number] [place], [place] - [address] [facility] [address] [place] [address] Phone: [phone] Fax: [phone]    Patient notified that their request is being sent to the clinical staff for review and that they should receive a response within 2 business days.   Please advise at [facility] [phone]

## 2023-06-25 MED ORDER — MELOXICAM 15 MG PO TABS: 15.0000 mg | ORAL_TABLET | Freq: Every day | ORAL | 0 refills | Status: DC

## 2023-06-25 NOTE — Telephone Encounter (Signed)
 Requested Prescriptions  Pending Prescriptions Disp Refills   meloxicam  (MOBIC ) 15 MG tablet 30 tablet 0    Sig: Take 1 tablet (15 mg total) by mouth daily.     Analgesics:  COX2 Inhibitors Failed - 06/25/2023  4:14 PM      Failed - Manual Review: Labs are only required if the patient has taken medication for more than 8 weeks.      Passed - HGB in normal range and within 360 days    Hemoglobin  Date Value Ref Range Status  11/20/2022 14.3 11.7 - 15.5 g/dL Final         Passed - Cr in normal range and within 360 days    Creat  Date Value Ref Range Status  11/20/2022 0.87 0.50 - 1.05 mg/dL Final         Passed - HCT in normal range and within 360 days    HCT  Date Value Ref Range Status  11/20/2022 43.2 35.0 - 45.0 % Final         Passed - AST in normal range and within 360 days    AST  Date Value Ref Range Status  11/20/2022 16 10 - 35 U/L Final         Passed - ALT in normal range and within 360 days    ALT  Date Value Ref Range Status  11/20/2022 20 6 - 29 U/L Final         Passed - eGFR is 30 or above and within 360 days    GFR, Est African American  Date Value Ref Range Status  01/08/2020 99 > OR = 60 mL/min/1.70m2 Final   GFR, Est Non African American  Date Value Ref Range Status  01/08/2020 86 > OR = 60 mL/min/1.23m2 Final   GFR, Estimated  Date Value Ref Range Status  07/25/2020 >60 >60 mL/min Final    Comment:    (NOTE) Calculated using the CKD-EPI Creatinine Equation (2021)    eGFR  Date Value Ref Range Status  11/20/2022 76 > OR = 60 mL/min/1.69m2 Final         Passed - Patient is not pregnant      Passed - Valid encounter within last 12 months    Recent Outpatient Visits           6 months ago Encounter for screening mammogram for malignant neoplasm of breast   [facility] Pickard, Cisco Crest, MD

## 2023-07-25 ENCOUNTER — Other Ambulatory Visit: Payer: Self-pay | Admitting: Family Medicine

## 2023-08-03 ENCOUNTER — Other Ambulatory Visit: Payer: Self-pay

## 2023-08-30 ENCOUNTER — Telehealth: Payer: Self-pay | Admitting: Family Medicine

## 2023-08-30 NOTE — Telephone Encounter (Signed)
 Prescription Request  08/30/2023  LOV: 12/10/2022  What is the name of the medication or equipment?   meloxicam  (MOBIC ) 15 MG tablet   Have you contacted your pharmacy to request a refill? Yes   Which pharmacy would you like this sent to?  [id number] [place], [place] - [address] [facility] [address] [place] [address] Phone: [phone] Fax: [phone]    Patient notified that their request is being sent to the clinical staff for review and that they should receive a response within 2 business days.   Please advise pharmacist.

## 2023-09-01 MED ORDER — MELOXICAM 15 MG PO TABS: 15.0000 mg | ORAL_TABLET | Freq: Every day | ORAL | 0 refills | Status: DC

## 2023-09-01 NOTE — Telephone Encounter (Signed)
 OV 12/10/22 Requested Prescriptions  Pending Prescriptions Disp Refills   meloxicam  (MOBIC ) 15 MG tablet 30 tablet 0    Sig: Take 1 tablet (15 mg total) by mouth daily.     Analgesics:  COX2 Inhibitors Failed - 09/01/2023  1:12 PM      Failed - Manual Review: Labs are only required if the patient has taken medication for more than 8 weeks.      Passed - HGB in normal range and within 360 days    Hemoglobin  Date Value Ref Range Status  11/20/2022 14.3 11.7 - 15.5 g/dL Final         Passed - Cr in normal range and within 360 days    Creat  Date Value Ref Range Status  11/20/2022 0.87 0.50 - 1.05 mg/dL Final         Passed - HCT in normal range and within 360 days    HCT  Date Value Ref Range Status  11/20/2022 43.2 35.0 - 45.0 % Final         Passed - AST in normal range and within 360 days    AST  Date Value Ref Range Status  11/20/2022 16 10 - 35 U/L Final         Passed - ALT in normal range and within 360 days    ALT  Date Value Ref Range Status  11/20/2022 20 6 - 29 U/L Final         Passed - eGFR is 30 or above and within 360 days    GFR, Est African American  Date Value Ref Range Status  01/08/2020 99 > OR = 60 mL/min/1.36m2 Final   GFR, Est Non African American  Date Value Ref Range Status  01/08/2020 86 > OR = 60 mL/min/1.34m2 Final   GFR, Estimated  Date Value Ref Range Status  07/25/2020 >60 >60 mL/min Final    Comment:    (NOTE) Calculated using the CKD-EPI Creatinine Equation (2021)    eGFR  Date Value Ref Range Status  11/20/2022 76 > OR = 60 mL/min/1.56m2 Final         Passed - Patient is not pregnant      Passed - Valid encounter within last 12 months    Recent Outpatient Visits           8 months ago Encounter for screening mammogram for malignant neoplasm of breast   [facility] Pickard, Butler DASEN, MD

## 2023-09-17 ENCOUNTER — Other Ambulatory Visit: Payer: Self-pay | Admitting: Family Medicine

## 2023-09-17 DIAGNOSIS — R0609 Other forms of dyspnea: Secondary | ICD-10-CM

## 2023-10-11 ENCOUNTER — Other Ambulatory Visit: Payer: Self-pay | Admitting: Family Medicine

## 2023-11-08 ENCOUNTER — Other Ambulatory Visit: Payer: Self-pay | Admitting: Family Medicine

## 2023-11-08 NOTE — Telephone Encounter (Signed)
 Prescription Request  11/08/2023  LOV: 12/10/2022  What is the name of the medication or equipment?   meloxicam  (MOBIC ) 15 MG tablet   Have you contacted your pharmacy to request a refill? Yes   Which pharmacy would you like this sent to?  [id number] [place], [place] - [address] [address] [place] [address] Phone: [phone] Fax: [phone]    Patient notified that their request is being sent to the clinical staff for review and that they should receive a response within 2 business days.   Please advise pharmacist.

## 2023-11-10 ENCOUNTER — Telehealth: Payer: Self-pay

## 2023-11-10 NOTE — Telephone Encounter (Signed)
 Requested medication (s) are due for refill today: routing for review  Requested medication (s) are on the active medication list: yes  Last refill:  10/12/23  Future visit scheduled: no  Notes to clinic:  should patient continue to to take, routing for review     Requested Prescriptions  Pending Prescriptions Disp Refills   meloxicam  (MOBIC ) 15 MG tablet 30 tablet 0    Sig: Take 1 tablet (15 mg total) by mouth daily.     Analgesics:  COX2 Inhibitors Failed - 11/10/2023 11:20 AM      Failed - Manual Review: Labs are only required if the patient has taken medication for more than 8 weeks.      Passed - HGB in normal range and within 360 days    Hemoglobin  Date Value Ref Range Status  11/20/2022 14.3 11.7 - 15.5 g/dL Final         Passed - Cr in normal range and within 360 days    Creat  Date Value Ref Range Status  11/20/2022 0.87 0.50 - 1.05 mg/dL Final         Passed - HCT in normal range and within 360 days    HCT  Date Value Ref Range Status  11/20/2022 43.2 35.0 - 45.0 % Final         Passed - AST in normal range and within 360 days    AST  Date Value Ref Range Status  11/20/2022 16 10 - 35 U/L Final         Passed - ALT in normal range and within 360 days    ALT  Date Value Ref Range Status  11/20/2022 20 6 - 29 U/L Final         Passed - eGFR is 30 or above and within 360 days    GFR, Est African American  Date Value Ref Range Status  01/08/2020 99 > OR = 60 mL/min/1.67m2 Final   GFR, Est Non African American  Date Value Ref Range Status  01/08/2020 86 > OR = 60 mL/min/1.62m2 Final   GFR, Estimated  Date Value Ref Range Status  07/25/2020 >60 >60 mL/min Final    Comment:    (NOTE) Calculated using the CKD-EPI Creatinine Equation (2021)    eGFR  Date Value Ref Range Status  11/20/2022 76 > OR = 60 mL/min/1.38m2 Final         Passed - Patient is not pregnant      Passed - Valid encounter within last 12 months    Recent Outpatient Visits            11 months ago Encounter for screening mammogram for malignant neoplasm of breast    [facility] Pickard, Butler DASEN, MD

## 2023-11-10 NOTE — Telephone Encounter (Signed)
 Copied from CRM #[id number]. Topic: Appointments - Appointment Scheduling >> Nov 10, 2023  2:05 PM Pinkey ORN wrote: Patient/patient representative is calling to schedule an appointment. Refer to attachments for appointment information. >> Nov 10, 2023  2:10 PM Pinkey ORN wrote: Patient is requesting to have her physical moved to the same day as her husbands which is scheduled for November 11th 10:15. Please follow up with patient.

## 2023-11-11 MED ORDER — MELOXICAM 15 MG PO TABS: 15.0000 mg | ORAL_TABLET | Freq: Every day | ORAL | 0 refills | Status: DC

## 2024-01-04 ENCOUNTER — Telehealth: Payer: PRIVATE HEALTH INSURANCE | Admitting: Physician Assistant

## 2024-01-04 DIAGNOSIS — J208 Acute bronchitis due to other specified organisms: Secondary | ICD-10-CM

## 2024-01-04 DIAGNOSIS — R0609 Other forms of dyspnea: Secondary | ICD-10-CM | POA: Diagnosis not present

## 2024-01-04 DIAGNOSIS — B9689 Other specified bacterial agents as the cause of diseases classified elsewhere: Secondary | ICD-10-CM

## 2024-01-04 MED ORDER — BENZONATATE 100 MG PO CAPS: 100.0000 mg | ORAL_CAPSULE | Freq: Three times a day (TID) | ORAL | 0 refills | Status: AC | PRN

## 2024-01-04 MED ORDER — DOXYCYCLINE HYCLATE 100 MG PO TABS: 100.0000 mg | ORAL_TABLET | Freq: Two times a day (BID) | ORAL | 0 refills | Status: AC

## 2024-01-04 MED ORDER — ALBUTEROL SULFATE HFA 108 (90 BASE) MCG/ACT IN AERS: 2.0000 | INHALATION_SPRAY | Freq: Four times a day (QID) | RESPIRATORY_TRACT | 1 refills | Status: AC | PRN

## 2024-01-04 NOTE — Progress Notes (Signed)
 We are sorry that you are not feeling well.  Here is how we plan to help!  Based on your presentation I believe you most likely have A cough due to bacteria.  When patients have a fever and a productive cough with a change in color or increased sputum production, we are concerned about bacterial bronchitis.  If left untreated it can progress to pneumonia.  If your symptoms do not improve with your treatment plan it is important that you contact your provider.   I have prescribed Doxycycline  100 mg twice a day for 7 days     In addition you may use A prescription cough medication called Tessalon  Perles 100mg . You may take 1-2 capsules every 8 hours as needed for your cough.   From your responses in the eVisit questionnaire you describe inflammation in the upper respiratory tract which is causing a significant cough.  This is commonly called Bronchitis and has four common causes:   Allergies Viral Infections Acid Reflux Bacterial Infection Allergies, viruses and acid reflux are treated by controlling symptoms or eliminating the cause. An example might be a cough caused by taking certain blood pressure medications. You stop the cough by changing the medication. Another example might be a cough caused by acid reflux. Controlling the reflux helps control the cough.  USE OF BRONCHODILATOR (RESCUE) INHALERS: There is a risk from using your bronchodilator too frequently.  The risk is that over-reliance on a medication which only relaxes the muscles surrounding the breathing tubes can reduce the effectiveness of medications prescribed to reduce swelling and congestion of the tubes themselves.  Although you feel brief relief from the bronchodilator inhaler, your asthma may actually be worsening with the tubes becoming more swollen and filled with mucus.  This can delay other crucial treatments, such as oral steroid medications. If you need to use a bronchodilator inhaler daily, several times per day, you  should discuss this with your provider.  There are probably better treatments that could be used to keep your asthma under control.     HOME CARE Only take medications as instructed by your medical team. Complete the entire course of an antibiotic. Drink plenty of fluids and get plenty of rest. Avoid close contacts especially the very young and the elderly Cover your mouth if you cough or cough into your sleeve. Always remember to wash your hands A steam or ultrasonic humidifier can help congestion.   GET HELP RIGHT AWAY IF: You develop worsening fever. You become short of breath You cough up blood. Your symptoms persist after you have completed your treatment plan MAKE SURE YOU  Understand these instructions. Will watch your condition. Will get help right away if you are not doing well or get worse.  Your e-visit answers were reviewed by a board certified advanced clinical practitioner to complete your personal care plan.  Depending on the condition, your plan could have included both over the counter or prescription medications. If there is a problem please reply  once you have received a response from your provider. Your safety is important to us .  If you have drug allergies check your prescription carefully.    You can use MyChart to ask questions about today's visit, request a non-urgent call back, or ask for a work or school excuse for 24 hours related to this e-Visit. If it has been greater than 24 hours you will need to follow up with your provider, or enter a new e-Visit to address those concerns. You  will get an e-mail in the next two days asking about your experience.  I hope that your e-visit has been valuable and will speed your recovery. Thank you for using e-visits.   I have spent 5 minutes in review of e-visit questionnaire, review and updating patient chart, medical decision making and response to patient.   Elsie Velma Lunger, PA-C

## 2024-01-04 NOTE — Addendum Note (Signed)
 Addended by: GLADIS ELSIE BROCKS on: 01/04/2024 01:17 PM   Modules accepted: Orders

## 2024-01-10 ENCOUNTER — Ambulatory Visit: Payer: PRIVATE HEALTH INSURANCE | Admitting: Family Medicine

## 2024-01-10 ENCOUNTER — Encounter: Payer: Self-pay | Admitting: Family Medicine

## 2024-01-10 VITALS — BP 150/102 | HR 90 | Temp 97.9°F | Ht 67.0 in | Wt 300.0 lb

## 2024-01-10 DIAGNOSIS — Z6841 Body Mass Index (BMI) 40.0 and over, adult: Secondary | ICD-10-CM

## 2024-01-10 DIAGNOSIS — E038 Other specified hypothyroidism: Secondary | ICD-10-CM

## 2024-01-10 DIAGNOSIS — I10 Essential (primary) hypertension: Secondary | ICD-10-CM

## 2024-01-10 DIAGNOSIS — Z0000 Encounter for general adult medical examination without abnormal findings: Secondary | ICD-10-CM

## 2024-01-10 DIAGNOSIS — J4521 Mild intermittent asthma with (acute) exacerbation: Secondary | ICD-10-CM

## 2024-01-10 LAB — COMPREHENSIVE METABOLIC PANEL WITH GFR
AG Ratio: 1.1 (calc) (ref 1.0–2.5)
ALT: 48 U/L — ABNORMAL HIGH (ref 6–29)
AST: 33 U/L (ref 10–35)
Albumin: 3.9 g/dL (ref 3.6–5.1)
Alkaline phosphatase (APISO): 74 U/L (ref 37–153)
BUN: 19 mg/dL (ref 7–25)
CO2: 30 mmol/L (ref 20–32)
Calcium: 9.4 mg/dL (ref 8.6–10.4)
Chloride: 101 mmol/L (ref 98–110)
Creat: 0.78 mg/dL (ref 0.50–1.05)
Globulin: 3.5 g/dL (ref 1.9–3.7)
Glucose, Bld: 100 mg/dL — ABNORMAL HIGH (ref 65–99)
Potassium: 3.7 mmol/L (ref 3.5–5.3)
Sodium: 139 mmol/L (ref 135–146)
Total Bilirubin: 0.7 mg/dL (ref 0.2–1.2)
Total Protein: 7.4 g/dL (ref 6.1–8.1)
eGFR: 86 mL/min/1.73m2 (ref >=60)

## 2024-01-10 LAB — CBC WITH DIFFERENTIAL/PLATELET
Absolute Lymphocytes: 2400 {cells}/uL (ref 850–3900)
Absolute Monocytes: 721 {cells}/uL (ref 200–950)
Basophils Absolute: 72 {cells}/uL (ref 0–200)
Basophils Relative: 0.7 %
Eosinophils Absolute: 299 {cells}/uL (ref 15–500)
Eosinophils Relative: 2.9 %
HCT: 43.5 % (ref 35.9–46.0)
Hemoglobin: 14.1 g/dL (ref 11.7–15.5)
MCH: 28 pg (ref 27.0–33.0)
MCHC: 32.4 g/dL (ref 31.6–35.4)
MCV: 86.3 fL (ref 81.4–101.7)
MPV: 11.1 fL (ref 7.5–12.5)
Monocytes Relative: 7 %
Neutro Abs: 6808 {cells}/uL (ref 1500–7800)
Neutrophils Relative %: 66.1 %
Platelets: 320 Thousand/uL (ref 140–400)
RBC: 5.04 Million/uL (ref 3.80–5.10)
RDW: 13.7 % (ref 11.0–15.0)
Total Lymphocyte: 23.3 %
WBC: 10.3 Thousand/uL (ref 3.8–10.8)

## 2024-01-10 LAB — LIPID PANEL
Cholesterol: 148 mg/dL (ref <200)
HDL: 37 mg/dL — ABNORMAL LOW (ref >=50)
LDL Cholesterol (Calc): 92 mg/dL
Non-HDL Cholesterol (Calc): 111 mg/dL (ref <130)
Total CHOL/HDL Ratio: 4 (calc) (ref <5.0)
Triglycerides: 94 mg/dL (ref <150)

## 2024-01-10 MED ORDER — PREDNISONE 20 MG PO TABS: ORAL_TABLET | ORAL | 0 refills | Status: AC

## 2024-01-10 MED ORDER — AMLODIPINE BESYLATE 10 MG PO TABS: 10.0000 mg | ORAL_TABLET | Freq: Every day | ORAL | 3 refills | Status: AC

## 2024-01-10 MED ORDER — HYDROCHLOROTHIAZIDE 25 MG PO TABS: 25.0000 mg | ORAL_TABLET | Freq: Every day | ORAL | 3 refills | Status: AC

## 2024-01-10 NOTE — Progress Notes (Signed)
 Subjective:    Patient ID: Diana Hamilton, female    DOB: [birth year], 63 y.o.   MRN: [id number]  HPI Patient is a 63 year old Caucasian female who is here today for a physical exam.  Of note she is wheezing on exam.  She is coughing constantly.  She had an e-visit and was started on doxycycline  and states she is doing some better however she does report shortness of breath.  She is using her husband's albuterol .  Her blood pressure today is elevated at 150/102.  She has not been taking the amlodipine  or hydrochlorothiazide  consistently.  Her mammogram is due.  She does not want me to schedule this today.  Patient had a negative Cologuard 2024.  This is due again in 2027.  She does not require Pap smear due to her history of a hysterectomy.  She is due for the pneumonia vaccine, a flu shot, COVID-vaccine.  Immunization History  Administered Date(s) Administered   Hepatitis A 07/03/2005   Influenza, Seasonal, Injecte, Preservative Fre 12/10/2022   Influenza,inj,Quad PF,6+ Mos 04/11/2015, 04/09/2016, 04/22/2018   Tdap 08/08/2010, 04/13/2016   Typhoid Inactivated 07/03/2005   Zoster Recombinant(Shingrix ) 12/10/2022, 02/26/2023    Past Medical History:  Diagnosis Date   Chronic venous insufficiency    DDD (degenerative disc disease)    Depression    Hyperlipidemia    Hypertension    Peripheral neuropathy    Positive ANA (antinuclear antibody)    Thyroid  disease    Past Surgical History:  Procedure Laterality Date   ABDOMINAL HYSTERECTOMY  01/2006   fibroids   Current Outpatient Medications on File Prior to Visit  Medication Sig Dispense Refill   albuterol  (VENTOLIN  HFA) 108 (90 Base) MCG/ACT inhaler Inhale 2 puffs into the lungs every 6 (six) hours as needed for wheezing or shortness of breath. 6.7 each 1   benzonatate  (TESSALON ) 100 MG capsule Take 1 capsule (100 mg total) by mouth 3 (three) times daily as needed for cough. 30 capsule 0   doxycycline  (VIBRA -TABS) 100 MG tablet Take  1 tablet (100 mg total) by mouth 2 (two) times daily. 14 tablet 0   levothyroxine  (SYNTHROID ) 50 MCG tablet Take 1 tablet (50 mcg total) by mouth daily. 90 tablet 3   losartan  (COZAAR ) 100 MG tablet Take 1 tablet (100 mg total) by mouth daily. 90 tablet 3   meloxicam  (MOBIC ) 15 MG tablet Take 1 tablet (15 mg total) by mouth daily. 30 tablet 0   No current facility-administered medications on file prior to visit.   Allergies  Allergen Reactions   Lisinopril  Cough   Sulfa Antibiotics    Social History   Socioeconomic History   Marital status: Married    Spouse name: Not on file   Number of children: 2   Years of education: Not on file   Highest education level: 12th grade  Occupational History   Occupation: textile  Tobacco Use   Smoking status: Never   Smokeless tobacco: Never  Substance and Sexual Activity   Alcohol use: No   Drug use: No   Sexual activity: Not on file  Other Topics Concern   Not on file  Social History Narrative   Not on file   Social Drivers of Health   Financial Resource Strain: Low Risk  (01/03/2024)   Overall Financial Resource Strain (CARDIA)    Difficulty of Paying Living Expenses: Not hard at all  Food Insecurity: No Food Insecurity (01/03/2024)   Hunger Vital Sign    Worried  About Running Out of Food in the Last Year: Never true    Ran Out of Food in the Last Year: Never true  Transportation Needs: No Transportation Needs (01/03/2024)   PRAPARE - Administrator, Civil Service (Medical): No    Lack of Transportation (Non-Medical): No  Physical Activity: Inactive (01/03/2024)   Exercise Vital Sign    Days of Exercise per Week: 0 days    Minutes of Exercise per Session: Not on file  Stress: No Stress Concern Present (01/03/2024)   [employer] of Occupational Health - Occupational Stress Questionnaire    Feeling of Stress: Not at all  Social Connections: Moderately Integrated (01/03/2024)   Social Connection and Isolation  Panel    Frequency of Communication with Friends and Family: Twice a week    Frequency of Social Gatherings with Friends and Family: Twice a week    Attends Religious Services: 1 to 4 times per year    Active Member of [employer] or Organizations: No    Attends Engineer, Structural: Not on file    Marital Status: Married  Catering Manager Violence: Not on file   Family History  Problem Relation Age of Onset   Heart disease Mother        arrhythmia   Cancer Mother 45       breast   Breast cancer Mother    Hypertension Father    Stroke Father    Breast cancer Sister    Cancer Sister 16       breast   Breast cancer Maternal Aunt    Breast cancer Paternal Aunt    Breast cancer Paternal Aunt    Breast cancer Paternal Aunt      Review of Systems  All other systems reviewed and are negative.      Objective:   Physical Exam Vitals reviewed.  Constitutional:      General: She is not in acute distress.    Appearance: She is well-developed. She is not diaphoretic.  HENT:     Head: Normocephalic and atraumatic.     Right Ear: External ear normal.     Left Ear: External ear normal.     Nose: Nose normal.     Mouth/Throat:     Pharynx: No oropharyngeal exudate.  Eyes:     General: No scleral icterus.       Right eye: No discharge.        Left eye: No discharge.     Conjunctiva/sclera: Conjunctivae normal.     Pupils: Pupils are equal, round, and reactive to light.  Neck:     Thyroid : No thyromegaly.     Vascular: No JVD.     Trachea: No tracheal deviation.  Cardiovascular:     Rate and Rhythm: Normal rate and regular rhythm.     Heart sounds: Normal heart sounds. No murmur heard.    No friction rub.  Pulmonary:     Effort: Pulmonary effort is normal. No respiratory distress.     Breath sounds: No stridor. Wheezing present.  Chest:     Chest wall: No tenderness.  Abdominal:     General: Bowel sounds are normal. There is no distension.     Palpations: Abdomen is  soft. There is no mass.     Tenderness: There is no abdominal tenderness. There is no guarding or rebound.  Musculoskeletal:        General: No tenderness. Normal range of motion.     Cervical  back: Normal range of motion and neck supple.  Lymphadenopathy:     Cervical: No cervical adenopathy.  Skin:    General: Skin is warm.     Coloration: Skin is not pale.     Findings: No erythema or rash.  Neurological:     Mental Status: She is alert and oriented to person, place, and time.     Cranial Nerves: No cranial nerve deficit.     Motor: No abnormal muscle tone.     Coordination: Coordination normal.     Deep Tendon Reflexes: Reflexes are normal and symmetric.  Psychiatric:        Behavior: Behavior normal.        Thought Content: Thought content normal.        Judgment: Judgment normal.           Assessment & Plan:  Primary hypertension - Plan: amLODipine  (NORVASC ) 10 MG tablet, CBC with Differential/Platelet, Comprehensive metabolic panel with GFR, Lipid panel  General medical exam  Other specified hypothyroidism  Mild intermittent asthmatic bronchitis with acute exacerbation  BMI 45.0-49.9, adult (HCC)  Patient's blood pressure is extremely high today.  Resume amlodipine  and hydrochlorothiazide  in addition to her angiotensin receptor blocker.  Recheck blood pressure in 2 weeks.  With regards to her asthmatic bronchitis, will begin a prednisone  taper pack in addition to albuterol  2 puffs every 6 hours.  Recheck in 48 hours if no better. Check CBC, CMP, fasting lipid panel, TSH.  Recommended a mammogram.  Patient declines a mammogram today.  Recommended flu shot, and the pneumonia vaccine.  Patient defers these due to the fact she is on prednisone .  She will get the immunizations once she is off the prednisone .

## 2024-01-11 ENCOUNTER — Ambulatory Visit: Payer: Self-pay | Admitting: Family Medicine

## 2024-01-17 ENCOUNTER — Other Ambulatory Visit: Payer: Self-pay | Admitting: Family Medicine

## 2024-01-21 ENCOUNTER — Ambulatory Visit: Payer: PRIVATE HEALTH INSURANCE

## 2024-01-25 ENCOUNTER — Other Ambulatory Visit: Payer: Self-pay | Admitting: Family Medicine

## 2024-02-18 ENCOUNTER — Other Ambulatory Visit: Payer: Self-pay | Admitting: Family Medicine

## 2024-02-18 ENCOUNTER — Telehealth: Payer: Self-pay | Admitting: Family Medicine

## 2024-02-18 NOTE — Telephone Encounter (Signed)
 Already refilled on 02/18/23 in a separate refill encounter.

## 2024-02-18 NOTE — Telephone Encounter (Signed)
 Prescription Request  02/18/2024  LOV: 01/10/2024  What is the name of the medication or equipment?   meloxicam  (MOBIC ) 15 MG tablet   Have you contacted your pharmacy to request a refill? Yes   Which pharmacy would you like this sent to?  [id number] [place], [place] - [address] [place] [address] Phone: [phone] Fax: [phone]    Patient notified that their request is being sent to the clinical staff for review and that they should receive a response within 2 business days.   Please advise pharmacist.

## 2024-02-21 MED ORDER — MELOXICAM 15 MG PO TABS: 15.0000 mg | ORAL_TABLET | Freq: Every day | ORAL | 3 refills | Status: AC

## 2024-02-21 NOTE — Telephone Encounter (Signed)
 Requested Prescriptions  Pending Prescriptions Disp Refills   meloxicam  (MOBIC ) 15 MG tablet 90 tablet 3    Sig: Take 1 tablet (15 mg total) by mouth daily.     Analgesics:  COX2 Inhibitors Failed - 02/21/2024 10:10 AM      Failed - Manual Review: Labs are only required if the patient has taken medication for more than 8 weeks.      Failed - ALT in normal range and within 360 days    ALT  Date Value Ref Range Status  01/10/2024 48 (H) 6 - 29 U/L Final         Passed - HGB in normal range and within 360 days    Hemoglobin  Date Value Ref Range Status  01/10/2024 14.1 11.7 - 15.5 g/dL Final         Passed - Cr in normal range and within 360 days    Creat  Date Value Ref Range Status  01/10/2024 0.78 0.50 - 1.05 mg/dL Final         Passed - HCT in normal range and within 360 days    HCT  Date Value Ref Range Status  01/10/2024 43.5 35.9 - 46.0 % Final         Passed - AST in normal range and within 360 days    AST  Date Value Ref Range Status  01/10/2024 33 10 - 35 U/L Final         Passed - eGFR is 30 or above and within 360 days    GFR, Est African American  Date Value Ref Range Status  01/08/2020 99 > OR = 60 mL/min/1.71m2 Final   GFR, Est Non African American  Date Value Ref Range Status  01/08/2020 86 > OR = 60 mL/min/1.24m2 Final   GFR, Estimated  Date Value Ref Range Status  07/25/2020 >60 >60 mL/min Final    Comment:    (NOTE) Calculated using the CKD-EPI Creatinine Equation (2021)    eGFR  Date Value Ref Range Status  01/10/2024 86 > OR = 60 mL/min/1.51m2 Final         Passed - Patient is not pregnant      Passed - Valid encounter within last 12 months    Recent Outpatient Visits           1 month ago Primary hypertension   [facility] Pick[place], Butler DASEN, MD   1 year ago Encounter for screening mammogram for malignant neoplasm of breast   [facility] Pickard, Butler DASEN, MD

## 2024-02-22 ENCOUNTER — Telehealth: Payer: Self-pay | Admitting: Family Medicine

## 2024-02-22 NOTE — Telephone Encounter (Signed)
 Copied from CRM #[id number]. Topic: General - Other >> Feb 21, 2024  4:47 PM Victoria B wrote: Reason for CRM: patient wants to try weightloss pills and not zepbound

## 2024-03-03 ENCOUNTER — Telehealth: Payer: Self-pay | Admitting: Family Medicine

## 2024-03-03 ENCOUNTER — Other Ambulatory Visit: Payer: Self-pay

## 2024-03-03 DIAGNOSIS — E038 Other specified hypothyroidism: Secondary | ICD-10-CM

## 2024-03-03 MED ORDER — LEVOTHYROXINE SODIUM 50 MCG PO TABS: 50.0000 ug | ORAL_TABLET | Freq: Every day | ORAL | 3 refills | Status: AC

## 2024-03-03 NOTE — Telephone Encounter (Signed)
 Prescription Request  03/03/2024  LOV: 01/10/2024  What is the name of the medication or equipment?   levothyroxine  (SYNTHROID ) 50 MCG tablet  **90 day script requested**  Have you contacted your pharmacy to request a refill? Yes   Which pharmacy would you like this sent to?  [id number] [place], [place] - [address] [place] [address] Phone: [phone] Fax: [phone]    Patient notified that their request is being sent to the clinical staff for review and that they should receive a response within 2 business days.   Please advise pharmacist.

## 2025-01-01 ENCOUNTER — Other Ambulatory Visit

## 2025-01-11 ENCOUNTER — Encounter: Admitting: Family Medicine
